# Patient Record
Sex: Male | Born: 1985 | Race: White | Hispanic: No | Marital: Single | Smoking: Current every day smoker
Health system: Southern US, Community
[De-identification: ages and names within clinical notes are randomized; demographics above are authoritative.]

## PROBLEM LIST (undated history)

## (undated) DIAGNOSIS — Z789 Other specified health status: Secondary | ICD-10-CM

## (undated) HISTORY — PX: HERNIA REPAIR: SHX51

---
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
## 2008-03-07 ENCOUNTER — Emergency Department (HOSPITAL_COMMUNITY): Admission: EM | Admit: 2008-03-07 | Discharge: 2008-03-07 | Payer: Self-pay | Admitting: Emergency Medicine

## 2008-11-03 ENCOUNTER — Emergency Department (HOSPITAL_COMMUNITY): Admission: EM | Admit: 2008-11-03 | Discharge: 2008-11-04 | Payer: Self-pay | Admitting: Emergency Medicine

## 2009-02-24 ENCOUNTER — Emergency Department (HOSPITAL_COMMUNITY): Admission: EM | Admit: 2009-02-24 | Discharge: 2009-02-24 | Payer: Self-pay | Admitting: Emergency Medicine

## 2009-10-03 ENCOUNTER — Emergency Department: Payer: Self-pay | Admitting: Emergency Medicine

## 2010-08-29 ENCOUNTER — Emergency Department: Payer: Self-pay | Admitting: Emergency Medicine

## 2010-10-27 LAB — CBC
MCHC: 34.2 g/dL (ref 30.0–36.0)
MCV: 82.4 fL (ref 78.0–100.0)
RBC: 5.29 MIL/uL (ref 4.22–5.81)
RDW: 13.2 % (ref 11.5–15.5)

## 2010-10-27 LAB — URINALYSIS, ROUTINE W REFLEX MICROSCOPIC
Bilirubin Urine: NEGATIVE
Ketones, ur: NEGATIVE mg/dL
Leukocytes, UA: NEGATIVE
Nitrite: NEGATIVE
Protein, ur: NEGATIVE mg/dL
Urobilinogen, UA: 0.2 mg/dL (ref 0.0–1.0)
pH: 6 (ref 5.0–8.0)

## 2010-10-27 LAB — DIFFERENTIAL
Basophils Absolute: 0.1 10*3/uL (ref 0.0–0.1)
Basophils Relative: 1 % (ref 0–1)
Eosinophils Absolute: 0.1 10*3/uL (ref 0.0–0.7)
Monocytes Absolute: 0.8 10*3/uL (ref 0.1–1.0)
Monocytes Relative: 10 % (ref 3–12)
Neutrophils Relative %: 62 % (ref 43–77)

## 2010-10-27 LAB — BASIC METABOLIC PANEL
CO2: 28 mEq/L (ref 19–32)
Chloride: 103 mEq/L (ref 96–112)
Creatinine, Ser: 0.92 mg/dL (ref 0.4–1.5)
GFR calc Af Amer: >60 mL/min (ref >60)
Glucose, Bld: 90 mg/dL (ref 70–99)

## 2010-12-23 ENCOUNTER — Emergency Department: Payer: Self-pay | Admitting: Emergency Medicine

## 2011-01-15 ENCOUNTER — Emergency Department: Payer: Self-pay | Admitting: Emergency Medicine

## 2011-03-21 ENCOUNTER — Emergency Department: Payer: Self-pay | Admitting: *Deleted

## 2011-04-17 ENCOUNTER — Emergency Department: Payer: Self-pay | Admitting: Unknown Physician Specialty

## 2011-04-18 ENCOUNTER — Emergency Department: Payer: Self-pay | Admitting: Emergency Medicine

## 2011-05-08 ENCOUNTER — Emergency Department (HOSPITAL_COMMUNITY)
Admission: EM | Admit: 2011-05-08 | Discharge: 2011-05-08 | Disposition: A | Discharge status: Home / Self Care | Payer: 59 | Attending: Emergency Medicine | Admitting: Emergency Medicine

## 2011-05-08 DIAGNOSIS — K029 Dental caries, unspecified: Secondary | ICD-10-CM | POA: Insufficient documentation

## 2011-05-08 DIAGNOSIS — K089 Disorder of teeth and supporting structures, unspecified: Secondary | ICD-10-CM | POA: Insufficient documentation

## 2011-05-08 DIAGNOSIS — K047 Periapical abscess without sinus: Secondary | ICD-10-CM | POA: Insufficient documentation

## 2011-06-15 ENCOUNTER — Emergency Department: Payer: Self-pay | Admitting: Emergency Medicine

## 2011-06-22 ENCOUNTER — Emergency Department: Payer: Self-pay | Admitting: Emergency Medicine

## 2011-08-13 ENCOUNTER — Encounter (HOSPITAL_COMMUNITY): Payer: Self-pay | Admitting: Emergency Medicine

## 2011-08-13 ENCOUNTER — Emergency Department (HOSPITAL_COMMUNITY)
Admission: EM | Admit: 2011-08-13 | Discharge: 2011-08-13 | Disposition: A | Discharge status: Home / Self Care | Payer: 59 | Attending: Emergency Medicine | Admitting: Emergency Medicine

## 2011-08-13 DIAGNOSIS — K0889 Other specified disorders of teeth and supporting structures: Secondary | ICD-10-CM

## 2011-08-13 DIAGNOSIS — K089 Disorder of teeth and supporting structures, unspecified: Secondary | ICD-10-CM | POA: Insufficient documentation

## 2011-08-13 DIAGNOSIS — K029 Dental caries, unspecified: Secondary | ICD-10-CM | POA: Insufficient documentation

## 2011-08-13 MED ORDER — PENICILLIN V POTASSIUM 250 MG PO TABS: 250.0000 mg | ORAL_TABLET | Freq: Four times a day (QID) | ORAL | Status: AC

## 2011-08-13 MED ORDER — HYDROCODONE-ACETAMINOPHEN 5-325 MG PO TABS: 1.0000 | ORAL_TABLET | ORAL | Status: AC | PRN

## 2011-08-13 MED ORDER — OXYCODONE-ACETAMINOPHEN 5-325 MG PO TABS
2.0000 | ORAL_TABLET | Freq: Once | ORAL | Status: AC
  Administered 2011-08-13: 2 via ORAL
  Filled 2011-08-13: qty 2

## 2011-08-13 NOTE — ED Notes (Signed)
Pt. Reports progressing left lower molar pain for 2 days , unrelieved by OTC pain medications.

## 2011-08-13 NOTE — ED Provider Notes (Signed)
History     CSN: [id number]  Arrival date & time 08/13/11  2016   First MD Initiated Contact with Patient 08/13/11 2136      Chief Complaint  Patient presents with  . Dental Pain    (Consider location/radiation/quality/duration/timing/severity/associated sxs/prior treatment) Patient is a 26 y.o. male presenting with tooth pain. The history is provided by the patient. No language interpreter was used.  Dental PainThe primary symptoms include mouth pain. The symptoms began 2 days ago. The symptoms are worsening. The symptoms occur frequently.  Additional symptoms include: dental sensitivity to temperature and gum tenderness.    History reviewed. No pertinent past medical history.  Past Surgical History  Procedure Date  . Hernia repair     No family history on file.  History  Substance Use Topics  . Smoking status: Current Everyday Smoker  . Smokeless tobacco: Not on file  . Alcohol Use: No      Review of Systems  HENT: Positive for dental problem.   All other systems reviewed and are negative.    Allergies  Review of patient's allergies indicates no known allergies.  Home Medications   Current Outpatient Rx  Name Route Sig Dispense Refill  . HYDROCODONE-ACETAMINOPHEN 5-325 MG PO TABS Oral Take 1 tablet by mouth every 4 (four) hours as needed for pain. 20 tablet 0  . PENICILLIN V POTASSIUM 250 MG PO TABS Oral Take 1 tablet (250 mg total) by mouth 4 (four) times daily. 40 tablet 0    BP 137/54  Pulse 97  Temp(Src) 97.5 F (36.4 C) (Oral)  Resp 20  SpO2 98%  Physical Exam  Constitutional: He is oriented to person, place, and time. He appears well-developed and well-nourished.  HENT:  Head: Normocephalic.  Mouth/Throat: Dental caries present.    Eyes: Pupils are equal, round, and reactive to light.  Neck: Normal range of motion. Neck supple.  Cardiovascular: Normal rate, regular rhythm, normal heart sounds and intact distal pulses.   Pulmonary/Chest:  Effort normal and breath sounds normal.  Abdominal: Soft. Bowel sounds are normal.  Musculoskeletal: Normal range of motion.  Lymphadenopathy:    He has cervical adenopathy.  Neurological: He is alert and oriented to person, place, and time.  Skin: Skin is warm and dry.  Psychiatric: He has a normal mood and affect.    ED Course  Procedures (including critical care time)  Labs Reviewed - No data to display No results found.   1. Dental caries   2. Pain, dental       MDM          Jimmye Norman, NP 08/13/11 2320

## 2011-08-13 NOTE — ED Notes (Signed)
Complaining of tooth pain left lower tooth. States pain began 2 days ago and has gotten progressively worse. Has history of abscess. Has tried several OTC with no relief. Rates pain as 7/10

## 2011-08-13 NOTE — ED Provider Notes (Signed)
Medical screening examination/treatment/procedure(s) were performed by non-physician practitioner and as supervising physician I was immediately available for consultation/collaboration.  Areyanna Figeroa R. Damaris Abeln, MD 08/13/11 2330 

## 2012-05-31 ENCOUNTER — Emergency Department: Payer: Self-pay | Admitting: Emergency Medicine

## 2012-06-25 ENCOUNTER — Emergency Department: Payer: Self-pay | Admitting: Emergency Medicine

## 2012-06-25 LAB — URINALYSIS, COMPLETE
Blood: NEGATIVE
Ph: 8 (ref 4.5–8.0)
Protein: NEGATIVE
Specific Gravity: 1.019 (ref 1.003–1.030)
Squamous Epithelial: <1
WBC UR: 1 /HPF (ref 0–5)

## 2012-07-09 ENCOUNTER — Emergency Department: Payer: Self-pay | Admitting: Emergency Medicine

## 2012-07-09 LAB — URINALYSIS, COMPLETE
Bacteria: NONE SEEN
Glucose,UR: NEGATIVE mg/dL (ref 0–75)
Nitrite: NEGATIVE
Protein: NEGATIVE
RBC,UR: NONE SEEN /HPF (ref 0–5)
Specific Gravity: 1.005 (ref 1.003–1.030)
WBC UR: 1 /HPF (ref 0–5)

## 2012-10-25 ENCOUNTER — Emergency Department (HOSPITAL_COMMUNITY)
Admission: EM | Admit: 2012-10-25 | Discharge: 2012-10-25 | Disposition: A | Discharge status: Home / Self Care | Payer: 59 | Attending: Emergency Medicine | Admitting: Emergency Medicine

## 2012-10-25 ENCOUNTER — Encounter (HOSPITAL_COMMUNITY): Payer: Self-pay | Admitting: Family Medicine

## 2012-10-25 ENCOUNTER — Emergency Department: Payer: Self-pay | Admitting: Emergency Medicine

## 2012-10-25 DIAGNOSIS — K0889 Other specified disorders of teeth and supporting structures: Secondary | ICD-10-CM

## 2012-10-25 DIAGNOSIS — K089 Disorder of teeth and supporting structures, unspecified: Secondary | ICD-10-CM | POA: Insufficient documentation

## 2012-10-25 DIAGNOSIS — Z8719 Personal history of other diseases of the digestive system: Secondary | ICD-10-CM | POA: Insufficient documentation

## 2012-10-25 DIAGNOSIS — F17200 Nicotine dependence, unspecified, uncomplicated: Secondary | ICD-10-CM | POA: Insufficient documentation

## 2012-10-25 MED ORDER — HYDROCODONE-ACETAMINOPHEN 5-325 MG PO TABS: ORAL_TABLET | ORAL | Status: DC

## 2012-10-25 MED ORDER — IBUPROFEN 600 MG PO TABS: 600.0000 mg | ORAL_TABLET | Freq: Four times a day (QID) | ORAL | Status: DC | PRN

## 2012-10-25 MED ORDER — PENICILLIN V POTASSIUM 500 MG PO TABS: 500.0000 mg | ORAL_TABLET | Freq: Three times a day (TID) | ORAL | Status: DC

## 2012-10-25 NOTE — ED Provider Notes (Signed)
History    This chart was scribed for non-physician practitioner Renne Crigler PA-C working with Richardean Canal, MD by Smitty Pluck, ED scribe. This patient was seen in room WTR5/WTR5 and the patient's care was started at 11:00 PM.   CSN: [id number]  Arrival date & time 10/25/12  2222      Chief Complaint  Patient presents with  . Dental Pain     The history is provided by the patient. No language interpreter was used.   Nicolas Bowers is a 27 y.o. male who presents to the Emergency Department complaining of constant, moderate left lower dental pain onset 3 days ago. Pt reports that he has taken ibuprofen and tylenol without relief. He reports that he has mild swelling. He thinks that it is due to a cavity. He reports hx of dental caries. Pt has dental appointment in 2 weeks.  Pt denies fever, chills, nausea, vomiting, diarrhea, weakness, cough, SOB and any other pain. The onset of this condition was acute. The course is constant. Aggravating factors: none. Alleviating factors: none.      History reviewed. No pertinent past medical history.  Past Surgical History  Procedure Laterality Date  . Hernia repair      No family history on file.  History  Substance Use Topics  . Smoking status: Current Every Day Smoker -- 1.00 packs/day    Types: Cigarettes  . Smokeless tobacco: Not on file  . Alcohol Use: No      Review of Systems  Constitutional: Negative for fever and chills.  HENT: Positive for dental problem. Negative for ear pain, sore throat, facial swelling, trouble swallowing and neck pain.   Respiratory: Negative for shortness of breath and stridor.   Gastrointestinal: Negative for nausea and vomiting.  Skin: Negative for color change.  Neurological: Negative for weakness and headaches.    Allergies  Review of patient's allergies indicates no known allergies.  Home Medications   Current Outpatient Rx  Name  Route  Sig  Dispense  Refill  .  HYDROcodone-acetaminophen (NORCO/VICODIN) 5-325 MG per tablet      Take 1-2 tablets every 6 hours as needed for severe pain   8 tablet   0   . ibuprofen (ADVIL,MOTRIN) 600 MG tablet   Oral   Take 1 tablet (600 mg total) by mouth every 6 (six) hours as needed for pain.   20 tablet   0   . penicillin v potassium (VEETID) 500 MG tablet   Oral   Take 1 tablet (500 mg total) by mouth 3 (three) times daily.   21 tablet   0     BP 114/57  Pulse 84  Temp(Src) 98.4 F (36.9 C) (Oral)  Resp 18  SpO2 97%  Physical Exam  Nursing note and vitals reviewed. Constitutional: He appears well-developed and well-nourished. No distress.  HENT:  Head: Normocephalic and atraumatic.  Left mandibular 2nd molar is broken  No abscess No erythema  No facial swelling   Eyes: EOM are normal.  Neck: Neck supple. No tracheal deviation present.  Cardiovascular: Normal rate.   Pulmonary/Chest: Effort normal. No respiratory distress.  Musculoskeletal: Normal range of motion.  Neurological: He is alert.  Skin: Skin is warm and dry.  Psychiatric: He has a normal mood and affect. His behavior is normal.    ED Course  Procedures (including critical care time) DIAGNOSTIC STUDIES: Oxygen Saturation is 97% on room air, normal by my interpretation.    COORDINATION OF CARE: 11:02  PM Discussed ED treatment with pt and pt agrees.  Filed Vitals:   10/25/12 2236  BP: 114/57  Pulse: 84  Temp: 98.4 F (36.9 C)  TempSrc: Oral  Resp: 18  SpO2: 97%      Labs Reviewed - No data to display No results found.   1. Pain, dental    Patient seen and examined.   Vital signs reviewed and are as follows: Filed Vitals:   10/25/12 2236  BP: 114/57  Pulse: 84  Temp: 98.4 F (36.9 C)  Resp: 18    Patient counseled to take prescribed medications as directed, return with worsening facial or neck swelling, and to follow-up with their dentist as soon as possible.   Patient counseled on use of narcotic  pain medications. Counseled not to combine these medications with others containing tylenol. Urged not to drink alcohol, drive, or perform any other activities that requires focus while taking these medications. The patient verbalizes understanding and agrees with the plan.     MDM  Patient with toothache.  No gross abscess.  Exam unconcerning for Ludwig's angina or other deep tissue infection in neck.  Will treat with penicillin and pain medicine.  Urged patient to follow-up with dentist.         I personally performed the services described in this documentation, which was scribed in my presence. The recorded information has been reviewed and is accurate.    Renne Crigler, PA-C 10/26/12 [phone]

## 2012-10-25 NOTE — ED Notes (Addendum)
Patient states that he has a toothache for the past 3 days. States pain has gotten worse. Has dentist appt in 2 weeks but pain is too bad. Broken tooth noted to left lower rear molar.

## 2012-10-28 NOTE — ED Provider Notes (Signed)
Medical screening examination/treatment/procedure(s) were performed by non-physician practitioner and as supervising physician I was immediately available for consultation/collaboration.   David H Yao, MD 10/28/12 2257 

## 2012-11-07 ENCOUNTER — Emergency Department (HOSPITAL_COMMUNITY)
Admission: EM | Admit: 2012-11-07 | Discharge: 2012-11-07 | Disposition: A | Discharge status: Home / Self Care | Payer: 59 | Attending: Emergency Medicine | Admitting: Emergency Medicine

## 2012-11-07 ENCOUNTER — Encounter (HOSPITAL_COMMUNITY): Payer: Self-pay | Admitting: *Deleted

## 2012-11-07 DIAGNOSIS — K089 Disorder of teeth and supporting structures, unspecified: Secondary | ICD-10-CM | POA: Insufficient documentation

## 2012-11-07 DIAGNOSIS — K0889 Other specified disorders of teeth and supporting structures: Secondary | ICD-10-CM

## 2012-11-07 DIAGNOSIS — F17200 Nicotine dependence, unspecified, uncomplicated: Secondary | ICD-10-CM | POA: Insufficient documentation

## 2012-11-07 MED ORDER — HYDROCODONE-ACETAMINOPHEN 5-325 MG PO TABS
1.0000 | ORAL_TABLET | Freq: Once | ORAL | Status: AC
  Administered 2012-11-07: 1 via ORAL
  Filled 2012-11-07: qty 1

## 2012-11-07 MED ORDER — HYDROCODONE-ACETAMINOPHEN 5-325 MG PO TABS: 1.0000 | ORAL_TABLET | ORAL | Status: DC | PRN

## 2012-11-07 NOTE — ED Notes (Signed)
Pt reporting toothache on left lower side.  Reports pain for about 3 days.

## 2012-11-07 NOTE — ED Provider Notes (Signed)
History     CSN: [id number]  Arrival date & time 11/07/12  0440   First MD Initiated Contact with Patient 11/07/12 0518      Chief Complaint  Patient presents with  . Dental Pain    (Consider location/radiation/quality/duration/timing/severity/associated sxs/prior treatment) HPI Nicolas Bowers is a 27 y.o. male who presents to the Emergency Department complaining of dental pain. He as seen in the ER 10/25/12 and given penicillin and hydrocodone for pain in the 2nd molar of the left mandibular. He is finished with the penicillin and has finished the hydrocodone. His appointment with the [facility] dental [facility] is this coming week. He has been taking ibuprofen without relief. He denies fever, chills, abscess, erythema, facial swelling.   History reviewed. No pertinent past medical history.  Past Surgical History  Procedure Laterality Date  . Hernia repair      History reviewed. No pertinent family history.  History  Substance Use Topics  . Smoking status: Current Every Day Smoker -- 1.00 packs/day    Types: Cigarettes  . Smokeless tobacco: Not on file  . Alcohol Use: No      Review of Systems  Constitutional: Negative for fever.       10 Systems reviewed and are negative for acute change except as noted in the HPI.  HENT: Positive for dental problem. Negative for congestion.   Eyes: Negative for discharge and redness.  Respiratory: Negative for cough and shortness of breath.   Cardiovascular: Negative for chest pain.  Gastrointestinal: Negative for vomiting and abdominal pain.  Musculoskeletal: Negative for back pain.  Skin: Negative for rash.  Neurological: Negative for syncope, numbness and headaches.  Psychiatric/Behavioral:       No behavior change.    Allergies  Review of patient's allergies indicates no known allergies.  Home Medications   Current Outpatient Rx  Name  Route  Sig  Dispense  Refill  . HYDROcodone-acetaminophen (NORCO/VICODIN) 5-325 MG per  tablet      Take 1-2 tablets every 6 hours as needed for severe pain   8 tablet   0   . ibuprofen (ADVIL,MOTRIN) 600 MG tablet   Oral   Take 1 tablet (600 mg total) by mouth every 6 (six) hours as needed for pain.   20 tablet   0   . penicillin v potassium (VEETID) 500 MG tablet   Oral   Take 1 tablet (500 mg total) by mouth 3 (three) times daily.   21 tablet   0     BP 110/78  Pulse 83  Temp(Src) 97.9 F (36.6 C) (Oral)  Resp 20  Ht 5\' 6"  (1.676 m)  Wt 130 lb (58.968 kg)  BMI 20.99 kg/m2  SpO2 96%  Physical Exam  Nursing note and vitals reviewed. Constitutional:  Awake, alert, nontoxic appearance.  HENT:  Head: Atraumatic.  2nd molar left mandibular broken.  Eyes: Right eye exhibits no discharge. Left eye exhibits no discharge.  Neck: Neck supple.  Cardiovascular: Normal rate.   Pulmonary/Chest: Effort normal and breath sounds normal. He exhibits no tenderness.  Abdominal: Soft. There is no tenderness. There is no rebound.  Musculoskeletal: He exhibits no tenderness.  Baseline ROM, no obvious new focal weakness.  Neurological:  Mental status and motor strength appears baseline for patient and situation.  Skin: No rash noted.  Psychiatric: He has a normal mood and affect.    ED Course  Procedures (including critical care time)  Medications  HYDROcodone-acetaminophen (NORCO/VICODIN) 5-325 MG per tablet 1  tablet (not administered)     MDM  Patient with pain to the 2nd molar of left mandibular area. No abscess noted. Given hydrocodone. Pt stable in ED with no significant deterioration in condition.The patient appears reasonably screened and/or stabilized for discharge and I doubt any other medical condition or other [facility] requiring further screening, evaluation, or treatment in the ED at this time prior to discharge.  MDM Reviewed: nursing note and vitals           Nicoletta Dress. Colon Branch, MD 11/07/12 [phone]

## 2013-02-04 ENCOUNTER — Encounter (HOSPITAL_COMMUNITY): Payer: Self-pay

## 2013-02-04 ENCOUNTER — Emergency Department (HOSPITAL_COMMUNITY)
Admission: EM | Admit: 2013-02-04 | Discharge: 2013-02-05 | Disposition: A | Discharge status: Home / Self Care | Payer: 59 | Attending: Emergency Medicine | Admitting: Emergency Medicine

## 2013-02-04 DIAGNOSIS — N509 Disorder of male genital organs, unspecified: Secondary | ICD-10-CM | POA: Insufficient documentation

## 2013-02-04 DIAGNOSIS — F17200 Nicotine dependence, unspecified, uncomplicated: Secondary | ICD-10-CM | POA: Insufficient documentation

## 2013-02-04 DIAGNOSIS — N50811 Right testicular pain: Secondary | ICD-10-CM

## 2013-02-04 LAB — URINALYSIS, ROUTINE W REFLEX MICROSCOPIC
Bilirubin Urine: NEGATIVE
Hgb urine dipstick: NEGATIVE
Nitrite: NEGATIVE
Protein, ur: NEGATIVE mg/dL
Urobilinogen, UA: 0.2 mg/dL (ref 0.0–1.0)

## 2013-02-04 MED ORDER — NAPROXEN 250 MG PO TABS
500.0000 mg | ORAL_TABLET | Freq: Once | ORAL | Status: AC
Start: 2013-02-04 — End: 2013-02-04
  Administered 2013-02-04: 500 mg via ORAL
  Filled 2013-02-04: qty 2

## 2013-02-04 MED ORDER — CEFTRIAXONE SODIUM 250 MG IJ SOLR
250.0000 mg | Freq: Once | INTRAMUSCULAR | Status: AC
  Administered 2013-02-04: 250 mg via INTRAMUSCULAR
  Filled 2013-02-04: qty 250

## 2013-02-04 MED ORDER — LIDOCAINE HCL (PF) 1 % IJ SOLN
INTRAMUSCULAR | Status: AC
  Administered 2013-02-04: 5 mL
  Filled 2013-02-04: qty 5

## 2013-02-04 MED ORDER — DOXYCYCLINE HYCLATE 100 MG PO CAPS: 100.0000 mg | ORAL_CAPSULE | Freq: Two times a day (BID) | ORAL | Status: DC

## 2013-02-04 MED ORDER — NAPROXEN 500 MG PO TABS: 500.0000 mg | ORAL_TABLET | Freq: Two times a day (BID) | ORAL | Status: DC

## 2013-02-04 NOTE — ED Provider Notes (Signed)
History    This chart was scribed for Vida Roller, MD by Quintella Reichert, ED scribe.  This patient was seen in room APA07/APA07 and the patient's care was started at 10:21 PM.   CSN: [id number]  Arrival date & time 02/04/13  2116    Chief Complaint  Patient presents with  . Testicle Pain    The history is provided by the patient. No language interpreter was used.     HPI Comments: Nicolas Bowers is a 27 y.o. male who presents to the Emergency Department complaining of 3 days of constant, progressively-worsening, moderate-to-severe bilateral testicular pain.  Pt denies injury.  He notes that the right testicle is more painful than the left.  He denies penile discharge or burning during urination but states "I get pain when I urinate."     History reviewed. No pertinent past medical history.   Past Surgical History  Procedure Laterality Date  . Hernia repair      No family history on file.   History  Substance Use Topics  . Smoking status: Current Every Day Smoker -- 1.00 packs/day    Types: Cigarettes  . Smokeless tobacco: Not on file  . Alcohol Use: No     Review of Systems A complete 10 system review of systems was obtained and all systems are negative except as noted in the HPI and PMH.     Allergies  Ultram  Home Medications   Current Outpatient Rx  Name  Route  Sig  Dispense  Refill  . ibuprofen (ADVIL,MOTRIN) 200 MG tablet   Oral   Take 600 mg by mouth every 6 (six) hours as needed for pain.         Marland Kitchen doxycycline (VIBRAMYCIN) 100 MG capsule   Oral   Take 1 capsule (100 mg total) by mouth 2 (two) times daily.   20 capsule   0   . naproxen (NAPROSYN) 500 MG tablet   Oral   Take 1 tablet (500 mg total) by mouth 2 (two) times daily with a meal.   30 tablet   0    There were no vitals taken for this visit.  Physical Exam  Nursing note and vitals reviewed. Constitutional: He appears well-developed and well-nourished. No distress.   HENT:  Head: Normocephalic and atraumatic.  Mouth/Throat: Oropharynx is clear and moist. No oropharyngeal exudate.  Eyes: Conjunctivae and EOM are normal. Pupils are equal, round, and reactive to light. Right eye exhibits no discharge. Left eye exhibits no discharge. No scleral icterus.  Neck: Normal range of motion. Neck supple. No JVD present. No thyromegaly present.  Cardiovascular: Normal rate, regular rhythm, normal heart sounds and intact distal pulses.  Exam reveals no gallop and no friction rub.   No murmur heard. Pulmonary/Chest: Effort normal and breath sounds normal. No respiratory distress. He has no wheezes. He has no rales.  Abdominal: Soft. Bowel sounds are normal. He exhibits no distension and no mass. There is no tenderness.  Genitourinary:  Mild tenderness on palpation to posterior right testicular . Testicles have a normal lie bilaterally. Normal cremasteric reflex bilaterally. Normal appearing scrotum. No discharge at the urethral meatus.  Musculoskeletal: Normal range of motion. He exhibits no edema and no tenderness.  Lymphadenopathy:    He has no cervical adenopathy.  Neurological: He is alert. Coordination normal.  Skin: Skin is warm and dry. No rash noted. No erythema.  Psychiatric: He has a normal mood and affect. His behavior is normal.  ED Course  Procedures (including critical care time)  DIAGNOSTIC STUDIES: Oxygen Saturation is 100% on room air, normal by my interpretation.    COORDINATION OF CARE: 10:25 PM-Discussed treatment plan which includes pain medication and labs with pt at bedside and pt agreed to plan.    Labs Reviewed  URINALYSIS, ROUTINE W REFLEX MICROSCOPIC - Abnormal; Notable for the following:    Specific Gravity, Urine >1.030 (*)    All other components within normal limits  GC/CHLAMYDIA PROBE AMP    No results found.  1. Testicular pain, right     MDM  The patient has a completely benign testicular exam other than mild  tenderness behind the right testicle compatible with epididymitis. He has a very normal cremasteric reflex, he has a normal lie of the testicles in the scrotum, there is no redness, no swelling, no asymmetry. urinalysis is negative for infection, the patient will be treated for epididymitis.  Meds given in ED:  Medications  cefTRIAXone (ROCEPHIN) injection 250 mg (not administered)  naproxen (NAPROSYN) tablet 500 mg (500 mg Oral Given 02/04/13 2238)    New Prescriptions   DOXYCYCLINE (VIBRAMYCIN) 100 MG CAPSULE    Take 1 capsule (100 mg total) by mouth 2 (two) times daily.   NAPROXEN (NAPROSYN) 500 MG TABLET    Take 1 tablet (500 mg total) by mouth 2 (two) times daily with a meal.      I personally performed the services described in this documentation, which was scribed in my presence. The recorded information has been reviewed and is accurate.      Vida Roller, MD 02/04/13 2325

## 2013-02-04 NOTE — ED Notes (Signed)
Bilateral testicular pain, no injury noted.

## 2013-02-18 ENCOUNTER — Emergency Department: Payer: Self-pay | Admitting: Emergency Medicine

## 2013-02-18 LAB — URINALYSIS, COMPLETE
Bilirubin,UR: NEGATIVE
Blood: NEGATIVE
Glucose,UR: NEGATIVE mg/dL (ref 0–75)
Ketone: NEGATIVE
Nitrite: NEGATIVE
Protein: NEGATIVE
WBC UR: <1 /HPF (ref 0–5)

## 2013-02-18 LAB — CBC WITH DIFFERENTIAL/PLATELET
Eosinophil #: 0.3 10*3/uL (ref 0.0–0.7)
Eosinophil %: 3.4 %
HGB: 14.2 g/dL (ref 13.0–18.0)
Lymphocyte #: 3.1 10*3/uL (ref 1.0–3.6)
Lymphocyte %: 41.6 %
MCHC: 33.8 g/dL (ref 32.0–36.0)
MCV: 82 fL (ref 80–100)
Monocyte #: 0.7 x10 3/mm (ref 0.2–1.0)
Neutrophil %: 43.2 %
Platelet: 177 10*3/uL (ref 150–440)
RBC: 5.15 10*6/uL (ref 4.40–5.90)
RDW: 13.3 % (ref 11.5–14.5)
WBC: 7.4 10*3/uL (ref 3.8–10.6)

## 2013-09-27 ENCOUNTER — Emergency Department: Payer: Self-pay | Admitting: Emergency Medicine

## 2014-07-23 ENCOUNTER — Emergency Department: Payer: Self-pay | Admitting: Emergency Medicine

## 2014-10-25 ENCOUNTER — Emergency Department: Admit: 2014-10-25 | Disposition: A | Discharge status: Home / Self Care | Payer: Self-pay | Admitting: Student

## 2014-12-25 ENCOUNTER — Emergency Department
Admission: EM | Admit: 2014-12-25 | Discharge: 2014-12-25 | Disposition: A | Discharge status: Home / Self Care | Payer: Self-pay | Attending: Emergency Medicine | Admitting: Emergency Medicine

## 2014-12-25 ENCOUNTER — Encounter: Payer: Self-pay | Admitting: Emergency Medicine

## 2014-12-25 DIAGNOSIS — Z720 Tobacco use: Secondary | ICD-10-CM | POA: Insufficient documentation

## 2014-12-25 DIAGNOSIS — K0381 Cracked tooth: Secondary | ICD-10-CM | POA: Insufficient documentation

## 2014-12-25 DIAGNOSIS — K0889 Other specified disorders of teeth and supporting structures: Secondary | ICD-10-CM

## 2014-12-25 DIAGNOSIS — K088 Other specified disorders of teeth and supporting structures: Secondary | ICD-10-CM | POA: Insufficient documentation

## 2014-12-25 MED ORDER — AMOXICILLIN 500 MG PO TABS: 500.0000 mg | ORAL_TABLET | Freq: Three times a day (TID) | ORAL | Status: DC

## 2014-12-25 MED ORDER — OXYCODONE-ACETAMINOPHEN 5-325 MG PO TABS: 1.0000 | ORAL_TABLET | ORAL | Status: DC | PRN

## 2014-12-25 MED ORDER — LIDOCAINE VISCOUS 2 % MT SOLN: 20.0000 mL | OROMUCOSAL | Status: DC | PRN

## 2014-12-25 MED ORDER — HYDROCODONE-ACETAMINOPHEN 5-325 MG PO TABS: 1.0000 | ORAL_TABLET | ORAL | Status: DC | PRN

## 2014-12-25 NOTE — ED Provider Notes (Signed)
[facility] Emergency Department Provider Note  ____________________________________________  Time seen: Approximately 10:14 AM  I have reviewed the triage vital signs and the nursing notes.   HISTORY  Chief Complaint Dental Pain    HPI Nicolas Bowers is a 29 y.o. male resents for evaluation of right lower dental pain. He reports that his tooth chipped yesterday. 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   History reviewed. No pertinent past medical history.  There are no active problems to display for this patient.   Past Surgical History  Procedure Laterality Date  . Hernia repair      Current Outpatient Rx  Name  Route  Sig  Dispense  Refill  . amoxicillin (AMOXIL) 500 MG tablet   Oral   Take 1 tablet (500 mg total) by mouth 3 (three) times daily.   30 tablet   0   . HYDROcodone-acetaminophen (NORCO) 5-325 MG per tablet   Oral   Take 1 tablet by mouth every 4 (four) hours as needed for moderate pain.   12 tablet   0   . ibuprofen (ADVIL,MOTRIN) 200 MG tablet   Oral   Take 600 mg by mouth every 6 (six) hours as needed for pain.         Marland Kitchen lidocaine (XYLOCAINE) 2 % solution   Mouth/Throat   Use as directed 20 mLs in the mouth or throat as needed for mouth pain.   100 mL   0   . naproxen (NAPROSYN) 500 MG tablet   Oral   Take 1 tablet (500 mg total) by mouth 2 (two) times daily with a meal.   30 tablet   0   . oxyCODONE-acetaminophen (ROXICET) 5-325 MG per tablet   Oral   Take 1-2 tablets by mouth every 4 (four) hours as needed for severe pain.   15 tablet   0     Allergies Ultram  History reviewed. No pertinent family history.  Social History History  Substance Use Topics  . Smoking status: Current Every Day Smoker -- 1.00 packs/day    Types: Cigarettes  . Smokeless tobacco: Not on file  . Alcohol Use: No    Review of Systems Constitutional: No fever/chills Eyes: No visual  changes. ENT: No sore throat. Positive right lower dental pain. Cardiovascular: Denies chest pain. Respiratory: Denies shortness of breath. Gastrointestinal: No abdominal pain.  No nausea, no vomiting.  No diarrhea.  No constipation. Genitourinary: Negative for dysuria. Musculoskeletal: Negative for back pain. Skin: Negative for rash. Neurological: Negative for headaches, focal weakness or numbness.  10-point ROS otherwise negative.  ____________________________________________   PHYSICAL EXAM:  VITAL SIGNS: ED Triage Vitals  Enc Vitals Group     BP 12/25/14 0958 137/80 mmHg     Pulse Rate 12/25/14 0958 77     Resp 12/25/14 0958 18     Temp 12/25/14 0958 97.8 F (36.6 C)     Temp Source 12/25/14 0958 Oral     SpO2 12/25/14 0958 100 %     Weight 12/25/14 0958 130 lb (58.968 kg)     Height 12/25/14 0958  (1.676 m)     Head Cir --      Peak Flow --      Pain Score 12/25/14 0959 8     Pain Loc --      Pain Edu? --      Excl. in GC? --  Constitutional: Alert and oriented. Well appearing and in no acute distress. Eyes: Conjunctivae are normal. PERRL. EOMI. Head: Atraumatic. Nose: No congestion/rhinnorhea. Mouth/Throat: Mucous membranes are moist.  Oropharynx non-erythematous. Obvious broken tooth with dental carries Neck: No stridor.  No Cervical adenopathy Neurologic:  Normal speech and language. No gross focal neurologic deficits are appreciated. Speech is normal. No gait instability. Skin:  Skin is warm, dry and intact. No rash noted. Psychiatric: Mood and affect are normal. Speech and behavior are normal.  ____________________________________________   LABS (all labs ordered are listed, but only abnormal results are displayed)  Labs Reviewed - No data to  display ____________________________________________  EKG  Deferred ____________________________________________  RADIOLOGY  Deferred ____________________________________________   PROCEDURES  Procedure(s) performed: None  Critical Care performed: No  ____________________________________________   INITIAL IMPRESSION / ASSESSMENT AND PLAN / ED COURSE  Pertinent labs & imaging results that were available during my care of the patient were reviewed by me and considered in my medical decision making (see chart for details).  Broken tooth/dental caries. Rx given for amoxicillin and Motrin 800 Percocet and viscous lidocaine. Patient given list of local dental clinics for follow-up. Patient voices no other emergency medical complaints at this time. He will return to the ER if symptoms worsen. ____________________________________________   FINAL CLINICAL IMPRESSION(S) / ED DIAGNOSES  Final diagnoses:  Pain, dental      Evangeline DakinCharles M Neizan Debruhl, PA-C 12/25/14 1040  Minna AntisKevin Paduchowski, MD 12/25/14 [phone]

## 2014-12-25 NOTE — Discharge Instructions (Signed)
Dental Pain °A tooth ache may be caused by cavities (tooth decay). Cavities expose the nerve of the tooth to air and hot or cold temperatures. It may come from an infection or abscess (also called a boil or furuncle) around your tooth. It is also often caused by dental caries (tooth decay). This causes the pain you are having. °DIAGNOSIS  °Your caregiver can diagnose this problem by exam. °TREATMENT  °· If caused by an infection, it may be treated with medications which kill germs (antibiotics) and pain medications as prescribed by your caregiver. Take medications as directed. °· Only take over-the-counter or prescription medicines for pain, discomfort, or fever as directed by your caregiver. °· Whether the tooth ache today is caused by infection or dental disease, you should see your dentist as soon as possible for further care. °SEEK MEDICAL CARE IF: °The exam and treatment you received today has been provided on an emergency basis only. This is not a substitute for complete medical or dental care. If your problem worsens or new problems (symptoms) appear, and you are unable to meet with your dentist, call or return to this location. °SEEK IMMEDIATE MEDICAL CARE IF:  °· You have a fever. °· You develop redness and swelling of your face, jaw, or neck. °· You are unable to open your mouth. °· You have severe pain uncontrolled by pain medicine. °MAKE SURE YOU:  °· Understand these instructions. °· Will watch your condition. °· Will get help right away if you are not doing well or get worse. °Document Released: 07/08/2005 Document Revised: 09/30/2011 Document Reviewed: 02/24/2008 °ExitCare® Patient Information ©2015 [employer]. This information is not intended to replace advice given to you by your health care provider. Make sure you discuss any questions you have with your health care provider. °OPTIONS FOR DENTAL FOLLOW UP CARE ° °[place] Department of Health and Human Services - Local Safety Net Dental  Clinics °http://www.ncdhhs.gov/dph/oralhealth/services/safetynetclinics.htm °  °[facility] (336-562-3123) ° °[facility] (919-933-9087) ° °[facility] (919-663-1744 ext 237) ° °[facility] (336-570-6415) ° °[facility] (919-968-2025) °This clinic caters to the indigent population and is on a lottery system. °Location: °[facility], [address], [place] °Clinic Hours: °Wednesdays from 6pm - 9pm, patients seen by a lottery system. °For dates, call or go to www.med.[facility].edu/[facility]/patients/Dental-[facility] °Services: °Cleanings, fillings and simple extractions. °Payment Options: °DENTAL WORK IS FREE OF CHARGE. Bring proof of income or support. °Best way to get seen: °Arrive at 5:15 pm - this is a lottery, NOT first come/first serve, so arriving earlier will not increase your chances of being seen. °  °  °[facility] °919-537-3737 °Select option 1 for emergencies °  °Location: °[facility], [address], [place] °Clinic Hours: °No walk-ins accepted - call the day before to schedule an appointment. °Check in times are 9:30 am and 1:30 pm. °Services: °Simple extractions, temporary fillings, pulpectomy/pulp debridement, uncomplicated abscess drainage. °Payment Options: °PAYMENT IS DUE AT THE TIME OF SERVICE.  Fee is usually $100-200, additional surgical procedures (e.g. abscess drainage) may be extra. °Cash, checks, Visa/MasterCard accepted.  Can file Medicaid if patient is covered for dental - patient should call case worker to check. °No discount for [facility] patients. °Best way to get seen: °MUST call the day before and get onto the schedule. Can usually be seen the next 1-2 days. No walk-ins accepted. °  °  °[facility] °[phone] °  °Location: °[facility], [address], [place] °Clinic Hours: °M, W, Th, F 8am or 1:30pm, Tues  9a or 1:30 - first come/first served. °Services: °Simple extractions, temporary fillings, uncomplicated abscess drainage.  You do not need to be an [place] resident. °Payment Options: °PAYMENT IS DUE AT THE TIME OF SERVICE. °Dental insurance, otherwise sliding scale - bring proof of income or support. °Depending on income and treatment needed, cost is usually $50-200. °Best way to get seen: °Arrive early as it is first come/first served. °  °  °[facility] °919-542-1641 °  °Location: °[address] °Clinic Hours: °Mon-Thu 8a-5p °Services: °Most basic dental services including extractions and fillings. °Payment Options: °PAYMENT IS DUE AT THE TIME OF SERVICE. °Sliding scale, up to 50% off - bring proof if income or support. °Medicaid with dental option accepted. °Best way to get seen: °Call to schedule an appointment, can usually be seen within 2 weeks OR they will try to see walk-ins - show up at 8a or 2p (you may have to wait). °  °  °[facility] °919-245-2435 °[place] RESIDENTS ONLY °  °Location: °[facility], [address], [place], [place] [address] °Clinic Hours: By appointment only. °Monday - Thursday 8am-5pm, Friday 8am-12pm °Services: Cleanings, fillings, extractions. °Payment Options: °PAYMENT IS DUE AT THE TIME OF SERVICE. °Cash, Visa or MasterCard. Sliding scale - $30 minimum per service. °Best way to get seen: °Come in to office, complete packet and make an appointment - need proof of income °or support monies for each household member and proof of [place] residence. °Usually takes about a month to get in. °  °  °[facility] °[phone] °  °Location: °[address] [place] °Clinic Hours: Walk-in Urgent Care Dental Services are offered Monday-Friday mornings only. °The numbers of emergencies accepted daily is limited to the number of °providers available. °Maximum 15 -  Mondays, Wednesdays & Thursdays °Maximum 10 - Tuesdays & Fridays °Services: °You do not need to be a [place] resident to be seen for a dental emergency. °Emergencies are defined as pain, swelling, abnormal bleeding, or dental trauma. Walkins will receive x-rays if needed. °NOTE: Dental cleaning is not an emergency. °Payment Options: °PAYMENT IS DUE AT THE TIME OF SERVICE. °Minimum co-pay is $40.00 for uninsured patients. °Minimum co-pay is $3.00 for Medicaid with dental coverage. °Dental Insurance is accepted and must be presented at time of visit. °Medicare does not cover dental. °Forms of payment: Cash, credit card, checks. °Best way to get seen: °If not previously registered with the clinic, walk-in dental registration begins at 7:15 am and is on a first come/first serve basis. °If previously registered with the clinic, call to make an appointment. °  °  °[facility] °919-776-4359 °[place] RESIDENTS ONLY °  °Location: °[address], [place], [place] °Clinic Hours: °Mon-Thu 10a-2p °Services: Extractions only! °Payment Options: °FREE (donations accepted) - bring proof of income or support °Best way to get seen: °Call and schedule an appointment OR come at 8am on the 1st Monday of every month (except for holidays) when it is first come/first served. °  °  °[facility] °[phone] °  °Location: °[address],  °Clinic Hours: °Friday mornings °Services, Payment Options, Best way to get seen: °Call for info °

## 2014-12-25 NOTE — ED Notes (Signed)
Patient c.o dental pain that started after tooth chipped yesterday. Pain located on right lower side.

## 2015-07-17 ENCOUNTER — Encounter: Payer: Self-pay | Admitting: Emergency Medicine

## 2015-07-17 ENCOUNTER — Emergency Department: Payer: BLUE CROSS/BLUE SHIELD

## 2015-07-17 ENCOUNTER — Emergency Department
Admission: EM | Admit: 2015-07-17 | Discharge: 2015-07-17 | Disposition: A | Discharge status: Home / Self Care | Payer: BLUE CROSS/BLUE SHIELD | Attending: Emergency Medicine | Admitting: Emergency Medicine

## 2015-07-17 DIAGNOSIS — J320 Chronic maxillary sinusitis: Secondary | ICD-10-CM | POA: Insufficient documentation

## 2015-07-17 DIAGNOSIS — R51 Headache: Secondary | ICD-10-CM | POA: Diagnosis present

## 2015-07-17 DIAGNOSIS — F17210 Nicotine dependence, cigarettes, uncomplicated: Secondary | ICD-10-CM | POA: Insufficient documentation

## 2015-07-17 DIAGNOSIS — Z792 Long term (current) use of antibiotics: Secondary | ICD-10-CM | POA: Insufficient documentation

## 2015-07-17 MED ORDER — NAPROXEN 500 MG PO TABS: 500.0000 mg | ORAL_TABLET | Freq: Two times a day (BID) | ORAL | Status: DC

## 2015-07-17 MED ORDER — SULFAMETHOXAZOLE-TRIMETHOPRIM 800-160 MG PO TABS: 1.0000 | ORAL_TABLET | Freq: Two times a day (BID) | ORAL | Status: DC

## 2015-07-17 MED ORDER — OXYCODONE-ACETAMINOPHEN 5-325 MG PO TABS
2.0000 | ORAL_TABLET | Freq: Once | ORAL | Status: AC
  Administered 2015-07-17: 2 via ORAL
  Filled 2015-07-17: qty 2

## 2015-07-17 MED ORDER — FEXOFENADINE-PSEUDOEPHED ER 60-120 MG PO TB12: 1.0000 | ORAL_TABLET | Freq: Two times a day (BID) | ORAL | Status: DC

## 2015-07-17 MED ORDER — OXYCODONE-ACETAMINOPHEN 5-325 MG PO TABS: 1.0000 | ORAL_TABLET | ORAL | Status: DC | PRN

## 2015-07-17 MED ORDER — SULFAMETHOXAZOLE-TRIMETHOPRIM 800-160 MG PO TABS
1.0000 | ORAL_TABLET | Freq: Once | ORAL | Status: AC
  Administered 2015-07-17: 1 via ORAL
  Filled 2015-07-17: qty 1

## 2015-07-17 NOTE — ED Notes (Signed)
Left sided facial pain, congestion for 2 days now.

## 2015-07-17 NOTE — ED Notes (Signed)
Pt in w/ complaints of left side facial pain and congestion x 2 days; pt states, "I have a sinus infection and I feel like my eyes are about to pop out of my head."  Pt in no immediate distress at this time.

## 2015-07-17 NOTE — ED Provider Notes (Signed)
[facility] Emergency Department Provider Note  ____________________________________________  Time seen: Approximately 11:16 AM  I have reviewed the triage vital signs and the nursing notes.   HISTORY  Chief Complaint Facial Pain and Nasal Congestion    HPI Nicolas Bowers is a 29 y.o. male patient complain severe headache and left upper facial pain for 2 days. Patient says he believes he has a sinus infection file like his eyes about the pop out of his head. He denies any fever associated this complaint. Patient's redness pain is 10 over 10 describe his pain as sharp and pressure. No palliative measures taken this complaint.   History reviewed. No pertinent past medical history.  There are no active problems to display for this patient.   Past Surgical History  Procedure Laterality Date  . Hernia repair      Current Outpatient Rx  Name  Route  Sig  Dispense  Refill  . amoxicillin (AMOXIL) 500 MG tablet   Oral   Take 1 tablet (500 mg total) by mouth 3 (three) times daily.   30 tablet   0   . fexofenadine-pseudoephedrine (ALLEGRA-D) 60-120 MG 12 hr tablet   Oral   Take 1 tablet by mouth 2 (two) times daily.   30 tablet   0   . HYDROcodone-acetaminophen (NORCO) 5-325 MG per tablet   Oral   Take 1 tablet by mouth every 4 (four) hours as needed for moderate pain.   12 tablet   0   . ibuprofen (ADVIL,MOTRIN) 200 MG tablet   Oral   Take 600 mg by mouth every 6 (six) hours as needed for pain.         Marland Kitchen. lidocaine (XYLOCAINE) 2 % solution   Mouth/Throat   Use as directed 20 mLs in the mouth or throat as needed for mouth pain.   100 mL   0   . naproxen (NAPROSYN) 500 MG tablet   Oral   Take 1 tablet (500 mg total) by mouth 2 (two) times daily with a meal.   30 tablet   0   . naproxen (NAPROSYN) 500 MG tablet   Oral   Take 1 tablet (500 mg total) by mouth 2 (two) times daily with a meal.   20 tablet   0   . oxyCODONE-acetaminophen  (ROXICET) 5-325 MG per tablet   Oral   Take 1-2 tablets by mouth every 4 (four) hours as needed for severe pain.   15 tablet   0   . oxyCODONE-acetaminophen (ROXICET) 5-325 MG tablet   Oral   Take 1 tablet by mouth every 4 (four) hours as needed for severe pain.   20 tablet   0   . sulfamethoxazole-trimethoprim (BACTRIM DS,SEPTRA DS) 800-160 MG tablet   Oral   Take 1 tablet by mouth 2 (two) times daily.   20 tablet   0     Allergies Ultram  No family history on file.  Social History Social History  Substance Use Topics  . Smoking status: Current Every Day Smoker -- 1.00 packs/day    Types: Cigarettes  . Smokeless tobacco: None  . Alcohol Use: No    Review of Systems Constitutional: No fever/chills Eyes: No visual changes. ENT: No sore throat. Left facial pain and nasal congestion. Cardiovascular: Denies chest pain. Respiratory: Denies shortness of breath. Gastrointestinal: No abdominal pain.  No nausea, no vomiting.  No diarrhea.  No constipation. Genitourinary: Negative for dysuria. Musculoskeletal: Negative for back pain. Skin:  Negative for rash. Neurological: Negative for headaches, focal weakness or numbness. Hematological/Lymphatic: Allergic/Immunilogical: Tramadol  10-point ROS otherwise negative.  ____________________________________________   PHYSICAL EXAM:  VITAL SIGNS: ED Triage Vitals  Enc Vitals Group     BP 07/17/15 1045 150/82 mmHg     Pulse Rate 07/17/15 1045 91     Resp 07/17/15 1045 18     Temp 07/17/15 1045 98.3 F (36.8 C)     Temp Source 07/17/15 1045 Oral     SpO2 07/17/15 1045 99 %     Weight 07/17/15 1045 135 lb (61.236 kg)     Height 07/17/15 1045  (1.676 m)     Head Cir --      Peak Flow --      Pain Score 07/17/15 1050 9     Pain Loc --      Pain Edu? --      Excl. in GC? --     Constitutional: Alert and oriented. Acute distress Eyes: Conjunctivae are normal. PERRL. EOMI. Head: Atraumatic. Nose: Edematous  nasal turbinates. Left Maxillary sinus guarding Mouth/Throat: Mucous membranes are moist.  Oropharynx non-erythematous. Neck: No stridor.  No cervical spine tenderness to palpation. Hematological/Lymphatic/Immunilogical: No cervical lymphadenopathy. Cardiovascular: Normal rate, regular rhythm. Grossly normal heart sounds.  Good peripheral circulation. Respiratory: Normal respiratory effort.  No retractions. Lungs CTAB. Gastrointestinal: Soft and nontender. No distention. No abdominal bruits. No CVA tenderness. Musculoskeletal: No lower extremity tenderness nor edema.  No joint effusions. Neurologic:  Normal speech and language. No gross focal neurologic deficits are appreciated. No gait instability. Skin:  Skin is warm, dry and intact. No rash noted. Psychiatric: Mood and affect are normal. Speech and behavior are normal.  ____________________________________________   LABS (all labs ordered are listed, but only abnormal results are displayed)  Labs Reviewed - No data to display ____________________________________________  EKG   ____________________________________________  RADIOLOGY  CT scanning show extensive left maxillary sinusitis ____________________________________________   PROCEDURES  Procedure(s) performed: None  Critical Care performed: No  ____________________________________________   INITIAL IMPRESSION / ASSESSMENT AND PLAN / ED COURSE  Pertinent labs & imaging results that were available during my care of the patient were reviewed by me and considered in my medical decision making (see chart for details).  Left maxillary sinusitis. Discussed CT findings with patient and advised follow-up with the ENT clinic as soon as possible. Patient will be started on amoxicillin, Percocets, and Allegra-D. ____________________________________________   FINAL CLINICAL IMPRESSION(S) / ED DIAGNOSES  Final diagnoses:  Chronic left maxillary sinusitis      Joni Reining, PA-C 07/17/15 1231  Jene Every, MD 07/17/15 1500

## 2015-07-17 NOTE — Discharge Instructions (Signed)
Last follow-up with the ENT clinic in 7-10 days for definitive evaluation and treatment. Sinusitis, Adult Sinusitis is redness, soreness, and puffiness (inflammation) of the air pockets in the bones of your face (sinuses). The redness, soreness, and puffiness can cause air and mucus to get trapped in your sinuses. This can allow germs to grow and cause an infection.  HOME CARE   Drink enough fluids to keep your pee (urine) clear or pale yellow.  Use a humidifier in your home.  Run a hot shower to create steam in the bathroom. Sit in the bathroom with the door closed. Breathe in the steam 3-4 times a day.  Put a warm, moist washcloth on your face 3-4 times a day, or as told by your doctor.  Use salt water sprays (saline sprays) to wet the thick fluid in your nose. This can help the sinuses drain.  Only take medicine as told by your doctor. GET HELP RIGHT AWAY IF:   Your pain gets worse.  You have very bad headaches.  You are sick to your stomach (nauseous).  You throw up (vomit).  You are very sleepy (drowsy) all the time.  Your face is puffy (swollen).  Your vision changes.  You have a stiff neck.  You have trouble breathing. MAKE SURE YOU:   Understand these instructions.  Will watch your condition.  Will get help right away if you are not doing well or get worse.   This information is not intended to replace advice given to you by your health care provider. Make sure you discuss any questions you have with your health care provider.   Document Released: 12/25/2007 Document Revised: 07/29/2014 Document Reviewed: 02/11/2012 Elsevier Interactive Patient Education [employer]2016 Elsevier Inc.

## 2015-10-01 ENCOUNTER — Encounter (HOSPITAL_COMMUNITY): Payer: Self-pay | Admitting: Emergency Medicine

## 2015-10-01 ENCOUNTER — Emergency Department (HOSPITAL_COMMUNITY)
Admission: EM | Admit: 2015-10-01 | Discharge: 2015-10-01 | Disposition: A | Discharge status: Home / Self Care | Payer: BLUE CROSS/BLUE SHIELD | Attending: Emergency Medicine | Admitting: Emergency Medicine

## 2015-10-01 DIAGNOSIS — N451 Epididymitis: Secondary | ICD-10-CM | POA: Insufficient documentation

## 2015-10-01 DIAGNOSIS — N50811 Right testicular pain: Secondary | ICD-10-CM | POA: Diagnosis present

## 2015-10-01 DIAGNOSIS — F17210 Nicotine dependence, cigarettes, uncomplicated: Secondary | ICD-10-CM | POA: Diagnosis not present

## 2015-10-01 DIAGNOSIS — Z791 Long term (current) use of non-steroidal anti-inflammatories (NSAID): Secondary | ICD-10-CM | POA: Diagnosis not present

## 2015-10-01 LAB — URINALYSIS, ROUTINE W REFLEX MICROSCOPIC
BILIRUBIN URINE: NEGATIVE
Glucose, UA: NEGATIVE mg/dL
HGB URINE DIPSTICK: NEGATIVE
KETONES UR: NEGATIVE mg/dL
Leukocytes, UA: NEGATIVE
Nitrite: NEGATIVE
Protein, ur: NEGATIVE mg/dL
pH: 6.5 (ref 5.0–8.0)

## 2015-10-01 MED ORDER — DOXYCYCLINE HYCLATE 100 MG PO TABS
100.0000 mg | ORAL_TABLET | Freq: Once | ORAL | Status: AC
  Administered 2015-10-01: 100 mg via ORAL
  Filled 2015-10-01: qty 1

## 2015-10-01 MED ORDER — CEFTRIAXONE SODIUM 250 MG IJ SOLR
250.0000 mg | Freq: Once | INTRAMUSCULAR | Status: AC
  Administered 2015-10-01: 250 mg via INTRAMUSCULAR
  Filled 2015-10-01: qty 250

## 2015-10-01 MED ORDER — HYDROCODONE-ACETAMINOPHEN 5-325 MG PO TABS
1.0000 | ORAL_TABLET | Freq: Once | ORAL | Status: AC
  Administered 2015-10-01: 1 via ORAL
  Filled 2015-10-01: qty 1

## 2015-10-01 MED ORDER — DOXYCYCLINE HYCLATE 100 MG PO CAPS: 100.0000 mg | ORAL_CAPSULE | Freq: Two times a day (BID) | ORAL | Status: DC

## 2015-10-01 MED ORDER — LIDOCAINE HCL (PF) 1 % IJ SOLN
INTRAMUSCULAR | Status: AC
  Administered 2015-10-01: 5 mL
  Filled 2015-10-01: qty 5

## 2015-10-01 MED ORDER — HYDROCODONE-ACETAMINOPHEN 5-325 MG PO TABS: 1.0000 | ORAL_TABLET | ORAL | Status: DC | PRN

## 2015-10-01 NOTE — ED Provider Notes (Signed)
CSN: [id number]     Arrival date & time 10/01/15  1427 History   First MD Initiated Contact with Patient 10/01/15 1438     Chief Complaint  Patient presents with  . Testicle Pain     (Consider location/radiation/quality/duration/timing/severity/associated sxs/prior Treatment) HPI   Nicolas Bowers is a 30 y.o. male who presents for evaluation of right testicle pain which started yesterday morning and has been persistent, since then. No known trauma. No associated urethral discharge. No dysuria. No new sexual contacts. He denies fever, chills, nausea, vomiting, cough, shortness of breath or chest pain. No prior similar problem. There are no other known modifying factors.   History reviewed. No pertinent past medical history. Past Surgical History  Procedure Laterality Date  . Hernia repair     History reviewed. No pertinent family history. Social History  Substance Use Topics  . Smoking status: Current Every Day Smoker -- 1.00 packs/day for 12 years    Types: Cigarettes  . Smokeless tobacco: Never Used  . Alcohol Use: No    Review of Systems  All other systems reviewed and are negative.     Allergies  Ultram  Home Medications   Prior to Admission medications   Medication Sig Start Date End Date Taking? Authorizing Provider  ibuprofen (ADVIL,MOTRIN) 200 MG tablet Take 800 mg by mouth every 6 (six) hours as needed for pain.    Yes Historical Provider, MD  amoxicillin (AMOXIL) 500 MG tablet Take 1 tablet (500 mg total) by mouth 3 (three) times daily. 12/25/14   Charmayne Sheerharles M Beers, PA-C  doxycycline (VIBRAMYCIN) 100 MG capsule Take 1 capsule (100 mg total) by mouth 2 (two) times daily. 10/01/15   Mancel BaleElliott Leam Madero, MD  fexofenadine-pseudoephedrine (ALLEGRA-D) 60-120 MG 12 hr tablet Take 1 tablet by mouth 2 (two) times daily. 07/17/15   Joni Reiningonald K Smith, PA-C  HYDROcodone-acetaminophen (NORCO) 5-325 MG tablet Take 1 tablet by mouth every 4 (four) hours as needed. 10/01/15   Mancel BaleElliott Margene Cherian,  MD  lidocaine (XYLOCAINE) 2 % solution Use as directed 20 mLs in the mouth or throat as needed for mouth pain. 12/25/14   Evangeline Dakinharles M Beers, PA-C  naproxen (NAPROSYN) 500 MG tablet Take 1 tablet (500 mg total) by mouth 2 (two) times daily with a meal. 02/04/13   Eber HongBrian Miller, MD  naproxen (NAPROSYN) 500 MG tablet Take 1 tablet (500 mg total) by mouth 2 (two) times daily with a meal. 07/17/15   Joni Reiningonald K Smith, PA-C  oxyCODONE-acetaminophen (ROXICET) 5-325 MG per tablet Take 1-2 tablets by mouth every 4 (four) hours as needed for severe pain. 12/25/14   Evangeline Dakinharles M Beers, PA-C  oxyCODONE-acetaminophen (ROXICET) 5-325 MG tablet Take 1 tablet by mouth every 4 (four) hours as needed for severe pain. 07/17/15   Joni Reiningonald K Smith, PA-C  sulfamethoxazole-trimethoprim (BACTRIM DS,SEPTRA DS) 800-160 MG tablet Take 1 tablet by mouth 2 (two) times daily. 07/17/15   Joni Reiningonald K Smith, PA-C   BP 154/79 mmHg  Pulse 68  Temp(Src) 97.7 F (36.5 C) (Oral)  Resp 16  Ht 5\' 6"  (1.676 m)  Wt 135 lb (61.236 kg)  BMI 21.80 kg/m2  SpO2 100% Physical Exam  Constitutional: He is oriented to person, place, and time. He appears well-developed and well-nourished. No distress.  HENT:  Head: Normocephalic and atraumatic.  Right Ear: External ear normal.  Left Ear: External ear normal.  Eyes: Conjunctivae and EOM are normal. Pupils are equal, round, and reactive to light.  Neck: Normal range of motion  and phonation normal. Neck supple.  Cardiovascular: Normal rate, regular rhythm and normal heart sounds.   Pulmonary/Chest: Effort normal and breath sounds normal. He exhibits no bony tenderness.  Abdominal: Soft. There is no tenderness.  Genitourinary:  Circumcised. Penis is normal. No urethral discharge. Scrotal contents evaluated. There is no deformity or swelling. Testicles are palpated, and are nontender to palpation. There is mild tenderness, posterior to the right testicle without palpable mass or deformity.  Musculoskeletal:  Normal range of motion.  Neurological: He is alert and oriented to person, place, and time. No cranial nerve deficit or sensory deficit. He exhibits normal muscle tone. Coordination normal.  Skin: Skin is warm, dry and intact.  Psychiatric: He has a normal mood and affect. His behavior is normal. Judgment and thought content normal.  Nursing note and vitals reviewed.   ED Course  Procedures (including critical care time)  Medications  cefTRIAXone (ROCEPHIN) injection 250 mg (not administered)  doxycycline (VIBRA-TABS) tablet 100 mg (not administered)  HYDROcodone-acetaminophen (NORCO/VICODIN) 5-325 MG per tablet 1 tablet (1 tablet Oral Given 10/01/15 1509)    Patient Vitals for the past 24 hrs:  BP Temp Temp src Pulse Resp SpO2 Height Weight  10/01/15 1433 154/79 mmHg 97.7 F (36.5 C) Oral 68 16 100 %  (1.676 m) 135 lb (61.236 kg)    4:45 PM Reevaluation with update and discussion. After initial assessment and treatment, an updated evaluation reveals he states he is feeling better at this time. Findings discussed with the patient and all questions were answered. [name]    Labs Review Labs Reviewed  URINALYSIS, ROUTINE W REFLEX MICROSCOPIC (NOT AT [facility]) - Abnormal; Notable for the following:    Specific Gravity, Urine <1.005 (*)    All other components within normal limits    Imaging Review No results found. I have personally reviewed and evaluated these images and lab results as part of my medical decision-making.   EKG Interpretation None      MDM   Final diagnoses:  Epididymitis    Evaluation consistent with epididymitis. Doubt orchitis, or testicular torsion. No evidence for hernia, systemic illness or suspected metabolic instability.  Nursing Notes Reviewed/ Care Coordinated Applicable Imaging Reviewed Interpretation of Laboratory Data incorporated into ED treatment  The patient appears reasonably screened and/or stabilized for discharge and I  doubt any other medical condition or other [facility] requiring further screening, evaluation, or treatment in the ED at this time prior to discharge.  Plan: Home Medications- Doxy, Norco; Home Treatments- rest, scrotal support; return here if the recommended treatment, does not improve the symptoms; Recommended follow up- PCP prn     Mancel Bale, MD 10/01/15 1656

## 2015-10-01 NOTE — ED Notes (Addendum)
Patient c/o pain in right testicle that started yesterday. Denies any injury. Denies any swelling, urinary symptoms, or discharge.

## 2015-10-01 NOTE — Discharge Instructions (Signed)
Epididymitis °Epididymitis is swelling (inflammation) of the epididymis. The epididymis is a cord-like structure that is located along the top and back part of the testicle. It collects and stores sperm from the testicle. °This condition can also cause pain and swelling of the testicle and scrotum. Symptoms usually start suddenly (acute epididymitis). Sometimes epididymitis starts gradually and lasts for a while (chronic epididymitis). This type may be harder to treat. °CAUSES °In men 35 and younger, this condition is usually caused by a bacterial infection or sexually transmitted disease (STD), such as: °· Gonorrhea. °· Chlamydia.   °In men 35 and older who do not have anal sex, this condition is usually caused by bacteria from a blockage or abnormalities in the urinary system. These can result from: °· Having a tube placed into the bladder (urinary catheter). °· Having an enlarged or inflamed prostate gland. °· Having recent urinary tract surgery. °In men who have a condition that weakens the body's defense system (immune system), such as HIV, this condition can be caused by:  °· Other bacteria, including tuberculosis and syphilis. °· Viruses. °· Fungi. °Sometimes this condition occurs without infection. That may happen if urine flows backward into the epididymis after heavy lifting or straining. °RISK FACTORS °This condition is more likely to develop in men: °· Who have unprotected sex with more than one partner. °· Who have anal sex.   °· Who have recently had surgery.   °· Who have a urinary catheter. °· Who have urinary problems. °· Who have a suppressed immune system.   °SYMPTOMS  °This condition usually begins suddenly with chills, fever, and pain behind the scrotum and in the testicle. Other symptoms include:  °· Swelling of the scrotum, testicle, or both. °· Pain when ejaculating or urinating. °· Pain in the back or belly. °· Nausea. °· Itching and discharge from the penis. °· Frequent need to pass  urine. °· Redness and tenderness of the scrotum. °DIAGNOSIS °Your health care provider can diagnose this condition based on your symptoms and medical history. Your health care provider will also do a physical exam to ask about your symptoms and check your scrotum and testicle for swelling, pain, and redness. You may also have other tests, including:   °· Examination of discharge from the penis. °· Urine tests for infections, such as STDs.   °Your health care provider may test you for other STDs, including HIV.  °TREATMENT °Treatment for this condition depends on the cause. If your condition is caused by a bacterial infection, oral antibiotic medicine may be prescribed. If the bacterial infection has spread to your blood, you may need to receive IV antibiotics. Nonbacterial epididymitis is treated with home care that includes bed rest and elevation of the scrotum. °Surgery may be needed to treat: °· Bacterial epididymitis that causes pus to build up in the scrotum (abscess). °· Chronic epididymitis that has not responded to other treatments. °HOME CARE INSTRUCTIONS °Medicines  °· Take over-the-counter and prescription medicines only as told by your health care provider.   °· If you were prescribed an antibiotic medicine, take it as told by your health care provider. Do not stop taking the antibiotic even if your condition improves. °Sexual Activity  °· If your epididymitis was caused by an STD, avoid sexual activity until your treatment is complete. °· Inform your sexual partner or partners if you test positive for an STD. They may need to be treated. Do not engage in sexual activity with your partner or partners until their treatment is completed. °General Instructions  °· Return to your normal activities as told   by your health care provider. Ask your health care provider what activities are safe for you. °· Keep your scrotum elevated and supported while resting. Ask your health care provider if you should wear a  scrotal support, such as a jockstrap. Wear it as told by your health care provider. °· If directed, apply ice to the affected area:   °¨ Put ice in a plastic bag. °¨ Place a towel between your skin and the bag. °¨ Leave the ice on for 20 minutes, 2-3 times per day. °· Try taking a sitz bath to help with discomfort. This is a warm water bath that is taken while you are sitting down. The water should only come up to your hips and should cover your buttocks. Do this 3-4 times per day or as told by your health care provider. °· Keep all follow-up visits as told by your health care provider. This is important. °SEEK MEDICAL CARE IF:  °· You have a fever.   °· Your pain medicine is not helping.   °· Your pain is getting worse.   °· Your symptoms do not improve within three days. °  °This information is not intended to replace advice given to you by your health care provider. Make sure you discuss any questions you have with your health care provider. °  °Document Released: 07/05/2000 Document Revised: 03/29/2015 Document Reviewed: 11/23/2014 °Elsevier Interactive Patient Education ©2016 [employer] ° °

## 2015-10-16 ENCOUNTER — Emergency Department: Payer: BLUE CROSS/BLUE SHIELD

## 2015-10-16 ENCOUNTER — Emergency Department
Admission: EM | Admit: 2015-10-16 | Discharge: 2015-10-16 | Disposition: A | Discharge status: Home / Self Care | Payer: BLUE CROSS/BLUE SHIELD | Attending: Emergency Medicine | Admitting: Emergency Medicine

## 2015-10-16 DIAGNOSIS — F17210 Nicotine dependence, cigarettes, uncomplicated: Secondary | ICD-10-CM | POA: Diagnosis not present

## 2015-10-16 DIAGNOSIS — N50812 Left testicular pain: Secondary | ICD-10-CM

## 2015-10-16 DIAGNOSIS — Z79899 Other long term (current) drug therapy: Secondary | ICD-10-CM | POA: Diagnosis not present

## 2015-10-16 DIAGNOSIS — Z792 Long term (current) use of antibiotics: Secondary | ICD-10-CM | POA: Diagnosis not present

## 2015-10-16 DIAGNOSIS — N50819 Testicular pain, unspecified: Secondary | ICD-10-CM

## 2015-10-16 MED ORDER — NAPROXEN 500 MG PO TABS: 500.0000 mg | ORAL_TABLET | Freq: Two times a day (BID) | ORAL | Status: DC

## 2015-10-16 NOTE — ED Notes (Signed)
Patient states onset yesterday of left sided testicular pain without known trauma. States it just started hurting. Denies dysuria, urgency or frequency. Denies back pain.

## 2015-10-16 NOTE — ED Notes (Signed)
Pt discharged to home.  Discharge instructions reviewed.  Verbalized understanding.  No questions or concerns at this time.  Teach back verified.  Pt in NAD.  No items left in ED.   

## 2015-10-16 NOTE — ED Provider Notes (Signed)
[facility] Emergency Department Provider Note  ____________________________________________  Time seen: Approximately 7:56 PM  I have reviewed the triage vital signs and the nursing notes.   HISTORY  Chief Complaint Testicle Pain    HPI Nicolas Bowers is a 30 y.o. male patient complaining of left testicular pain onset yesterday. Patient denies any history of trauma. Patient denies any urinary complaints. States no back pain. Patient was seen 15 days ago for complain of right testicle pain. Patient was diagnosed and treated for epididymitis. Patient rated his pain as a 7/10. Patient state he was told to follow-up with PCP but he does not have a family doctor.  History reviewed. No pertinent past medical history.  There are no active problems to display for this patient.   Past Surgical History  Procedure Laterality Date  . Hernia repair      Current Outpatient Rx  Name  Route  Sig  Dispense  Refill  . amoxicillin (AMOXIL) 500 MG tablet   Oral   Take 1 tablet (500 mg total) by mouth 3 (three) times daily.   30 tablet   0   . doxycycline (VIBRAMYCIN) 100 MG capsule   Oral   Take 1 capsule (100 mg total) by mouth 2 (two) times daily.   20 capsule   0   . fexofenadine-pseudoephedrine (ALLEGRA-D) 60-120 MG 12 hr tablet   Oral   Take 1 tablet by mouth 2 (two) times daily.   30 tablet   0   . HYDROcodone-acetaminophen (NORCO) 5-325 MG tablet   Oral   Take 1 tablet by mouth every 4 (four) hours as needed.   15 tablet   0   . ibuprofen (ADVIL,MOTRIN) 200 MG tablet   Oral   Take 800 mg by mouth every 6 (six) hours as needed for pain.          Marland Kitchen. lidocaine (XYLOCAINE) 2 % solution   Mouth/Throat   Use as directed 20 mLs in the mouth or throat as needed for mouth pain.   100 mL   0   . naproxen (NAPROSYN) 500 MG tablet   Oral   Take 1 tablet (500 mg total) by mouth 2 (two) times daily with a meal.   30 tablet   0   . naproxen  (NAPROSYN) 500 MG tablet   Oral   Take 1 tablet (500 mg total) by mouth 2 (two) times daily with a meal.   20 tablet   0   . oxyCODONE-acetaminophen (ROXICET) 5-325 MG per tablet   Oral   Take 1-2 tablets by mouth every 4 (four) hours as needed for severe pain.   15 tablet   0   . oxyCODONE-acetaminophen (ROXICET) 5-325 MG tablet   Oral   Take 1 tablet by mouth every 4 (four) hours as needed for severe pain.   20 tablet   0   . sulfamethoxazole-trimethoprim (BACTRIM DS,SEPTRA DS) 800-160 MG tablet   Oral   Take 1 tablet by mouth 2 (two) times daily.   20 tablet   0     Allergies Ultram  No family history on file.  Social History Social History  Substance Use Topics  . Smoking status: Current Every Day Smoker -- 1.00 packs/day for 12 years    Types: Cigarettes  . Smokeless tobacco: Never Used  . Alcohol Use: No    Review of Systems Constitutional: No fever/chills Eyes: No visual changes. ENT: No sore throat. Cardiovascular: Denies chest pain.  Respiratory: Denies shortness of breath. Gastrointestinal: No abdominal pain.  No nausea, no vomiting.  No diarrhea.  No constipation. Genitourinary: Negative for dysuria.Positive for left testicle pain. Musculoskeletal: Negative for back pain. Skin: Negative for rash. Neurological: Negative for headaches, focal weakness or numbness. Allergic/Immunilogical: Ultram 10-point ROS otherwise negative.  ____________________________________________   PHYSICAL EXAM:  VITAL SIGNS: ED Triage Vitals  Enc Vitals Group     BP 10/16/15 1912 135/88 mmHg     Pulse Rate 10/16/15 1912 74     Resp 10/16/15 1912 20     Temp 10/16/15 1912 98.4 F (36.9 C)     Temp Source 10/16/15 1912 Oral     SpO2 10/16/15 1912 99 %     Weight 10/16/15 1912 135 lb (61.236 kg)     Height 10/16/15 1912  (1.676 m)     Head Cir --      Peak Flow --      Pain Score 10/16/15 1914 7     Pain Loc --      Pain Edu? --      Excl. in GC? --      Constitutional: Alert and oriented. Well appearing and in no acute distress. Eyes: Conjunctivae are normal. PERRL. EOMI. Head: Atraumatic. Nose: No congestion/rhinnorhea. Mouth/Throat: Mucous membranes are moist.  Oropharynx non-erythematous. Neck: No stridor.  No cervical spine tenderness to palpation. Hematological/Lymphatic/Immunilogical: No cervical lymphadenopathy. Cardiovascular: Normal rate, regular rhythm. Grossly normal heart sounds.  Good peripheral circulation. Respiratory: Normal respiratory effort.  No retractions. Lungs CTAB. Gastrointestinal: Soft and nontender. No distention. No abdominal bruits. No CVA tenderness. Genitourinary: No obvious edema. Patient has some mild guarding palpation posterior left scrotum. Musculoskeletal: No lower extremity tenderness nor edema.  No joint effusions. Neurologic:  Normal speech and language. No gross focal neurologic deficits are appreciated. No gait instability. Skin:  Skin is warm, dry and intact. No rash noted. Psychiatric: Mood and affect are normal. Speech and behavior are normal.  ____________________________________________   LABS (all labs ordered are listed, but only abnormal results are displayed)  Labs Reviewed - No data to display ____________________________________________  EKG   ____________________________________________  RADIOLOGY  Ultrasound of the testicles and scrotum revealed small bilateral hydroceles small left epidural(but no evidence of testicular mass or torsion. Patient given a prescription for naproxen. ____________________________________________   PROCEDURES  Procedure(s) performed: None  Critical Care performed: No  ____________________________________________   INITIAL IMPRESSION / ASSESSMENT AND PLAN / ED COURSE  Pertinent labs & imaging results that were available during my care of the patient were reviewed by me and considered in my medical decision making (see chart for  details). Left testicular pain. Discuss ultrasound findings with patient. Advised patient to follow-up with urology for further evaluation. ____________________________________________   FINAL CLINICAL IMPRESSION(S) / ED DIAGNOSES  Final diagnoses:  Left testicular pain      Joni Reining, PA-C 10/16/15 2035  Myrna Blazer, MD 10/16/15 2039

## 2015-10-16 NOTE — Discharge Instructions (Signed)
Follow-up with urology for definitive evaluation and treatment. 

## 2015-11-25 ENCOUNTER — Emergency Department
Admission: EM | Admit: 2015-11-25 | Discharge: 2015-11-25 | Disposition: A | Discharge status: Home / Self Care | Payer: BLUE CROSS/BLUE SHIELD | Attending: Emergency Medicine | Admitting: Emergency Medicine

## 2015-11-25 DIAGNOSIS — S025XXA Fracture of tooth (traumatic), initial encounter for closed fracture: Secondary | ICD-10-CM | POA: Diagnosis not present

## 2015-11-25 DIAGNOSIS — K047 Periapical abscess without sinus: Secondary | ICD-10-CM

## 2015-11-25 DIAGNOSIS — Y999 Unspecified external cause status: Secondary | ICD-10-CM | POA: Insufficient documentation

## 2015-11-25 DIAGNOSIS — K032 Erosion of teeth: Secondary | ICD-10-CM

## 2015-11-25 DIAGNOSIS — K0889 Other specified disorders of teeth and supporting structures: Secondary | ICD-10-CM | POA: Diagnosis present

## 2015-11-25 DIAGNOSIS — F17210 Nicotine dependence, cigarettes, uncomplicated: Secondary | ICD-10-CM | POA: Insufficient documentation

## 2015-11-25 DIAGNOSIS — Y939 Activity, unspecified: Secondary | ICD-10-CM | POA: Diagnosis not present

## 2015-11-25 DIAGNOSIS — Z791 Long term (current) use of non-steroidal anti-inflammatories (NSAID): Secondary | ICD-10-CM | POA: Insufficient documentation

## 2015-11-25 DIAGNOSIS — K029 Dental caries, unspecified: Secondary | ICD-10-CM

## 2015-11-25 DIAGNOSIS — Y929 Unspecified place or not applicable: Secondary | ICD-10-CM | POA: Insufficient documentation

## 2015-11-25 DIAGNOSIS — X58XXXA Exposure to other specified factors, initial encounter: Secondary | ICD-10-CM | POA: Diagnosis not present

## 2015-11-25 MED ORDER — MAGIC MOUTHWASH W/LIDOCAINE: 5.0000 mL | Freq: Four times a day (QID) | ORAL | Status: DC

## 2015-11-25 MED ORDER — AMOXICILLIN 875 MG PO TABS: 875.0000 mg | ORAL_TABLET | Freq: Two times a day (BID) | ORAL | Status: DC

## 2015-11-25 NOTE — ED Notes (Signed)
Pt reports to ED w/ c/o dental pain r/t tooth abscess he noticed last night.  NAD.

## 2015-11-25 NOTE — ED Provider Notes (Signed)
[facility] Emergency Department Provider Note  ____________________________________________  Time seen: Approximately 8:09 PM  I have reviewed the triage vital signs and the nursing notes.   HISTORY  Chief Complaint Dental Pain    HPI Nicolas Bowers is a 30 y.o. male who presents emergency department complaining of right upper dental pain. Patient states that he has known poor dentition and has a "abscess" to one of his teeth. Patient is denying any fevers or chills, difficulty breathing or swallowing, chest pain, shortness breath, nausea or vomiting. Patient does not have a dentist. Patient reports that the pain is sharp, constant, unrelieved by anything.   History reviewed. No pertinent past medical history.  There are no active problems to display for this patient.   Past Surgical History  Procedure Laterality Date  . Hernia repair      Current Outpatient Rx  Name  Route  Sig  Dispense  Refill  . ibuprofen (ADVIL,MOTRIN) 200 MG tablet   Oral   Take 800 mg by mouth every 6 (six) hours as needed for pain.          Marland Kitchen amoxicillin (AMOXIL) 875 MG tablet   Oral   Take 1 tablet (875 mg total) by mouth 2 (two) times daily.   14 tablet   0   . doxycycline (VIBRAMYCIN) 100 MG capsule   Oral   Take 1 capsule (100 mg total) by mouth 2 (two) times daily.   20 capsule   0   . fexofenadine-pseudoephedrine (ALLEGRA-D) 60-120 MG 12 hr tablet   Oral   Take 1 tablet by mouth 2 (two) times daily.   30 tablet   0   . HYDROcodone-acetaminophen (NORCO) 5-325 MG tablet   Oral   Take 1 tablet by mouth every 4 (four) hours as needed.   15 tablet   0   . lidocaine (XYLOCAINE) 2 % solution   Mouth/Throat   Use as directed 20 mLs in the mouth or throat as needed for mouth pain.   100 mL   0   . magic mouthwash w/lidocaine SOLN   Oral   Take 5 mLs by mouth 4 (four) times daily.   240 mL   0     Dispense in a 1/1/1/1 ratio. Use lidocaine, diphen  ...   . naproxen (NAPROSYN) 500 MG tablet   Oral   Take 1 tablet (500 mg total) by mouth 2 (two) times daily with a meal.   30 tablet   0   . naproxen (NAPROSYN) 500 MG tablet   Oral   Take 1 tablet (500 mg total) by mouth 2 (two) times daily with a meal.   20 tablet   0   . naproxen (NAPROSYN) 500 MG tablet   Oral   Take 1 tablet (500 mg total) by mouth 2 (two) times daily with a meal.   20 tablet   00   . oxyCODONE-acetaminophen (ROXICET) 5-325 MG per tablet   Oral   Take 1-2 tablets by mouth every 4 (four) hours as needed for severe pain.   15 tablet   0   . oxyCODONE-acetaminophen (ROXICET) 5-325 MG tablet   Oral   Take 1 tablet by mouth every 4 (four) hours as needed for severe pain.   20 tablet   0   . sulfamethoxazole-trimethoprim (BACTRIM DS,SEPTRA DS) 800-160 MG tablet   Oral   Take 1 tablet by mouth 2 (two) times daily.   20 tablet  0     Allergies Ultram  No family history on file.  Social History Social History  Substance Use Topics  . Smoking status: Current Every Day Smoker -- 1.00 packs/day for 12 years    Types: Cigarettes  . Smokeless tobacco: Never Used  . Alcohol Use: No     Review of Systems  Constitutional: No fever/chills ENT: No upper respiratory complaints.Positive for right lower dental pain. Cardiovascular: no chest pain. Respiratory: no cough. No SOB. Musculoskeletal: Negative for musculoskeletal pain. Skin: Negative for rash, abrasions, lacerations, ecchymosis. Neurological: Negative for headaches, focal weakness or numbness. 10-point ROS otherwise negative.  ____________________________________________   PHYSICAL EXAM:  VITAL SIGNS: ED Triage Vitals  Enc Vitals Group     BP 11/25/15 1843 135/81 mmHg     Pulse Rate 11/25/15 1843 82     Resp 11/25/15 1843 16     Temp 11/25/15 1843 98.1 F (36.7 C)     Temp Source 11/25/15 1843 Oral     SpO2 11/25/15 1843 99 %     Weight 11/25/15 1843 135 lb (61.236 kg)      Height 11/25/15 1843 5\' 6"  (1.676 m)     Head Cir --      Peak Flow --      Pain Score 11/25/15 1842 8     Pain Loc --      Pain Edu? --      Excl. in GC? --      Constitutional: Alert and oriented. Well appearing and in no acute distress. Eyes: Conjunctivae are normal. PERRL. EOMI. Head: Atraumatic. ENT:      Ears:       Nose: No congestion/rhinnorhea.      Mouth/Throat: Mucous membranes are moist. Multiple dental caries and erosions are noted throughout dentition. Patient has a fractured tooth to tooth #29. Multiple erosions and caries noted to some 30 through 32. Surrounding erythema and mild edema. Area is tender to palpation with tongue depressor. No fluctuance noted with palpation with tongue depressor. No drainage noted. Neck: No stridor.    Cardiovascular: Normal rate, regular rhythm. Normal S1 and S2.  Good peripheral circulation. Respiratory: Normal respiratory effort without tachypnea or retractions. Lungs CTAB. Good air entry to the bases with no decreased or absent breath sounds. Musculoskeletal: Full range of motion to all extremities. No gross deformities appreciated. Neurologic:  Normal speech and language. No gross focal neurologic deficits are appreciated.  Skin:  Skin is warm, dry and intact. No rash noted. Psychiatric: Mood and affect are normal. Speech and behavior are normal. Patient exhibits appropriate insight and judgement.   ____________________________________________   LABS (all labs ordered are listed, but only abnormal results are displayed)  Labs Reviewed - No data to display ____________________________________________  EKG   ____________________________________________  RADIOLOGY   No results found.  ____________________________________________    PROCEDURES  Procedure(s) performed:       Medications - No data to display   ____________________________________________   INITIAL IMPRESSION / ASSESSMENT AND PLAN / ED  COURSE  Pertinent labs & imaging results that were available during my care of the patient were reviewed by me and considered in my medical decision making (see chart for details).  Patient's diagnosis is consistent with dental infection with dental caries, erosions, and fractures. Patient declines dental block in the emergency department.. Patient will be discharged home with prescriptions for antibiotics and Magic mouthwash. Patient is given list of dental options to follow-up with.. Patient is to follow up with  [facility] or other dental option as listed in paperwork as needed or otherwise directed. Patient is given ED precautions to return to the ED for any worsening or new symptoms.     ____________________________________________  FINAL CLINICAL IMPRESSION(S) / ED DIAGNOSES  Final diagnoses:  Dental infection  Dental caries  Dental erosion  Broken tooth, closed, initial encounter      NEW MEDICATIONS STARTED DURING THIS VISIT:  New Prescriptions   AMOXICILLIN (AMOXIL) 875 MG TABLET    Take 1 tablet (875 mg total) by mouth 2 (two) times daily.   MAGIC MOUTHWASH W/LIDOCAINE SOLN    Take 5 mLs by mouth 4 (four) times daily.        This chart was dictated using voice recognition software/Dragon. Despite best efforts to proofread, errors can occur which can change the meaning. Any change was purely unintentional.    Racheal Patches, PA-C 11/25/15 2019  Emily Filbert, MD 11/25/15 2029

## 2015-11-25 NOTE — Discharge Instructions (Signed)
Dental Abscess °A dental abscess is a collection of pus in or around a tooth. °CAUSES °This condition is caused by a bacterial infection around the root of the tooth that involves the inner part of the tooth (pulp). It may result from: °· Severe tooth decay. °· Trauma to the tooth that allows bacteria to enter into the pulp, such as a broken or chipped tooth. °· Severe gum disease around a tooth. °SYMPTOMS °Symptoms of this condition include: °· Severe pain in and around the infected tooth. °· Swelling and redness around the infected tooth, in the mouth, or in the face. °· Tenderness. °· Pus drainage. °· Bad breath. °· Bitter taste in the mouth. °· Difficulty swallowing. °· Difficulty opening the mouth. °· Nausea. °· Vomiting. °· Chills. °· Swollen neck glands. °· Fever. °DIAGNOSIS °This condition is diagnosed with examination of the infected tooth. During the exam, your dentist may tap on the infected tooth. Your dentist will also ask about your medical and dental history and may order X-rays. °TREATMENT °This condition is treated by eliminating the infection. This may be done with: °· Antibiotic medicine. °· A root canal. This may be performed to save the tooth. °· Pulling (extracting) the tooth. This may also involve draining the abscess. This is done if the tooth cannot be saved. °HOME CARE INSTRUCTIONS °· Take medicines only as directed by your dentist. °· If you were prescribed antibiotic medicine, finish all of it even if you start to feel better. °· Rinse your mouth (gargle) often with salt water to relieve pain or swelling. °· Do not drive or operate heavy machinery while taking pain medicine. °· Do not apply heat to the outside of your mouth. °· Keep all follow-up visits as directed by your dentist. This is important. °SEEK MEDICAL CARE IF: °· Your pain is worse and is not helped by medicine. °SEEK IMMEDIATE MEDICAL CARE IF: °· You have a fever or chills. °· Your symptoms suddenly get worse. °· You have a  very bad headache. °· You have problems breathing or swallowing. °· You have trouble opening your mouth. °· You have swelling in your neck or around your eye. °  °This information is not intended to replace advice given to you by your health care provider. Make sure you discuss any questions you have with your health care provider. °  °Document Released: 07/08/2005 Document Revised: 11/22/2014 Document Reviewed: 07/05/2014 °Elsevier Interactive Patient Education ©2016 Elsevier Inc. ° °Dental Care and Dentist Visits °Dental care supports good overall health. Regular dental visits can also help you avoid dental pain, bleeding, infection, and other more serious health problems in the future. It is important to keep the mouth healthy because diseases in the teeth, gums, and other oral tissues can spread to other areas of the body. Some problems, such as diabetes, heart disease, and pre-term labor have been associated with poor oral health.  °See your dentist every 6 months. If you experience emergency problems such as a toothache or broken tooth, go to the dentist right away. If you see your dentist regularly, you may catch problems early. It is easier to be treated for problems in the early stages.  °WHAT TO EXPECT AT A DENTIST VISIT  °Your dentist will look for many common oral health problems and recommend proper treatment. At your regular dental visit, you can expect: °· Gentle cleaning of the teeth and gums. This includes scraping and polishing. This helps to remove the sticky substance around the teeth and gums (  plaque). Plaque forms in the mouth shortly after eating. Over time, plaque hardens on the teeth as tartar. If tartar is not removed regularly, it can cause problems. Cleaning also helps remove stains. °· Periodic X-rays. These pictures of the teeth and supporting bone will help your dentist assess the health of your teeth. °· Periodic fluoride treatments. Fluoride is a natural mineral shown to help  strengthen teeth. Fluoride treatment involves applying a fluoride gel or varnish to the teeth. It is most commonly done in children. °· Examination of the mouth, tongue, jaws, teeth, and gums to look for any oral health problems, such as: °¨ Cavities (dental caries). This is decay on the tooth caused by plaque, sugar, and acid in the mouth. It is best to catch a cavity when it is small. °¨ Inflammation of the gums caused by plaque buildup (gingivitis). °¨ Problems with the mouth or malformed or misaligned teeth. °¨ Oral cancer or other diseases of the soft tissues or jaws.  °KEEP YOUR TEETH AND GUMS HEALTHY °For healthy teeth and gums, follow these general guidelines as well as your dentist's specific advice: °· Have your teeth professionally cleaned at the dentist every 6 months. °· Brush twice daily with a fluoride toothpaste. °· Floss your teeth daily.  °· Ask your dentist if you need fluoride supplements, treatments, or fluoride toothpaste. °· Eat a healthy diet. Reduce foods and drinks with added sugar. °· Avoid smoking. °TREATMENT FOR ORAL HEALTH PROBLEMS °If you have oral health problems, treatment varies depending on the conditions present in your teeth and gums. °· Your caregiver will most likely recommend good oral hygiene at each visit. °· For cavities, gingivitis, or other oral health disease, your caregiver will perform a procedure to treat the problem. This is typically done at a separate appointment. Sometimes your caregiver will refer you to another dental specialist for specific tooth problems or for surgery. °SEEK IMMEDIATE DENTAL CARE IF: °· You have pain, bleeding, or soreness in the gum, tooth, jaw, or mouth area. °· A permanent tooth becomes loose or separated from the gum socket. °· You experience a blow or injury to the mouth or jaw area. °  °This information is not intended to replace advice given to you by your health care provider. Make sure you discuss any questions you have with your  health care provider. ° ° °OPTIONS FOR DENTAL FOLLOW UP CARE ° °[place] Department of Health and Human Services - Local Safety Net Dental Clinics °http://www.ncdhhs.gov/dph/oralhealth/services/safetynetclinics.htm °  °[facility] (336-562-3123) ° °[facility] (919-933-9087) ° °[facility] (919-663-1744 ext 237) ° °[facility] (336-570-6415) ° °[facility] (919-968-2025) °This clinic caters to the indigent population and is on a lottery system. °Location: °[facility], [address], [place] °Clinic Hours: °Wednesdays from 6pm - 9pm, patients seen by a lottery system. °For dates, call or go to www.med.[facility].edu/[facility]/patients/Dental-[facility] °Services: °Cleanings, fillings and simple extractions. °Payment Options: °DENTAL WORK IS FREE OF CHARGE. Bring proof of income or support. °Best way to get seen: °Arrive at 5:15 pm - this is a lottery, NOT first come/first serve, so arriving earlier will not increase your chances of being seen. °  °  °[facility] °919-537-3737 °Select option 1 for emergencies °  °Location: °[facility], [address], [place] °Clinic Hours: °No walk-ins accepted - call the day before to schedule an appointment. °Check in times are 9:30 am and 1:30 pm. °Services: °  Simple extractions, temporary fillings, pulpectomy/pulp debridement, uncomplicated abscess drainage. °Payment Options: °PAYMENT IS DUE AT THE TIME OF SERVICE.  Fee is usually $100-200, additional surgical procedures (e.g. abscess drainage) may be extra. °Cash, checks, Visa/MasterCard accepted.  Can file Medicaid if patient is covered for dental - patient should call case worker to check. °No discount for [facility] patients. °Best way to get seen: °MUST call the day before and get onto the schedule. Can usually be seen the next 1-2 days. No walk-ins accepted. °  °  °[facility] °[phone] °  °Location: °[facility], [address], [place] °Clinic Hours: °M, W, Th, F 8am or 1:30pm, Tues 9a or 1:30 - first come/first served. °Services: °Simple extractions, temporary fillings, uncomplicated abscess drainage.  You do not need to be an [place] resident. °Payment Options: °PAYMENT IS DUE AT THE TIME OF SERVICE. °Dental insurance, otherwise sliding scale - bring proof of income or support. °Depending on income and treatment needed, cost is usually $50-200. °Best way to get seen: °Arrive early as it is first come/first served. °  °  °[facility] °919-542-1641 °  °Location: °[address] °Clinic Hours: °Mon-Thu 8a-5p °Services: °Most basic dental services including extractions and fillings. °Payment Options: °PAYMENT IS DUE AT THE TIME OF SERVICE. °Sliding scale, up to 50% off - bring proof if income or support. °Medicaid with dental option accepted. °Best way to get seen: °Call to schedule an appointment, can usually be seen within 2 weeks OR they will try to see walk-ins - show up at 8a or 2p (you may have to wait). °  °  °[facility] °919-245-2435 °[place] RESIDENTS ONLY °  °Location: °[facility], [address], [place], [place] [address] °Clinic Hours: By appointment only. °Monday - Thursday 8am-5pm, Friday 8am-12pm °Services: Cleanings, fillings, extractions. °Payment Options: °PAYMENT IS DUE AT THE TIME OF SERVICE. °Cash, Visa or MasterCard. Sliding scale - $30 minimum per service. °Best way to get seen: °Come in to office, complete packet and make an appointment - need proof of income °or support monies for each household member and proof of [place] residence. °Usually takes about a month to get in. °  °  °[facility] °[phone] °  °Location: °[address] [place] °Clinic Hours: Walk-in Urgent Care Dental Services are  offered Monday-Friday mornings only. °The numbers of emergencies accepted daily is limited to the number of °providers available. °Maximum 15 - Mondays, Wednesdays & Thursdays °Maximum 10 - Tuesdays & Fridays °Services: °You do not need to be a [place] resident to be seen for a dental emergency. °Emergencies are defined as pain, swelling, abnormal bleeding, or dental trauma. Walkins will receive x-rays if needed. °NOTE: Dental cleaning is not an emergency. °Payment Options: °PAYMENT IS DUE AT THE TIME OF SERVICE. °Minimum co-pay is $40.00 for uninsured patients. °Minimum co-pay is $3.00 for Medicaid with dental coverage. °Dental Insurance is accepted and must be presented at time of visit. °Medicare does not cover dental. °Forms of payment: Cash, credit card, checks. °Best way to get seen: °If not previously registered with the clinic, walk-in dental registration begins at 7:15 am and is on a first come/first serve basis. °If previously registered with the clinic, call to make an appointment. °  °  °[facility] °919-776-4359 °[place] RESIDENTS ONLY °  °Location: °[address], [place], [place] °Clinic Hours: °Mon-Thu 10a-2p °Services:   Extractions only! °Payment Options: °FREE (donations accepted) - bring proof of income or support °Best way to get seen: °Call and schedule an appointment OR come at 8am on the 1st Monday of every month (except for holidays) when it is first come/first served. °  °  °[facility] °[phone] °  °Location: °[address], [place] °Clinic Hours: °Friday mornings °Services, Payment Options, Best way to get seen: °Call for info ° ° °  °Document Released: 03/20/2011 Document Revised: 09/30/2011 Document Reviewed: 03/20/2011 °Elsevier Interactive Patient Education ©2016 Elsevier Inc. ° °

## 2015-12-03 ENCOUNTER — Emergency Department (HOSPITAL_COMMUNITY)
Admission: EM | Admit: 2015-12-03 | Discharge: 2015-12-03 | Disposition: A | Discharge status: Home / Self Care | Payer: BLUE CROSS/BLUE SHIELD | Attending: Emergency Medicine | Admitting: Emergency Medicine

## 2015-12-03 ENCOUNTER — Encounter (HOSPITAL_COMMUNITY): Payer: Self-pay | Admitting: Emergency Medicine

## 2015-12-03 DIAGNOSIS — K029 Dental caries, unspecified: Secondary | ICD-10-CM | POA: Diagnosis not present

## 2015-12-03 DIAGNOSIS — F17210 Nicotine dependence, cigarettes, uncomplicated: Secondary | ICD-10-CM | POA: Diagnosis not present

## 2015-12-03 DIAGNOSIS — K0889 Other specified disorders of teeth and supporting structures: Secondary | ICD-10-CM | POA: Diagnosis present

## 2015-12-03 MED ORDER — CLINDAMYCIN HCL 300 MG PO CAPS: 300.0000 mg | ORAL_CAPSULE | Freq: Three times a day (TID) | ORAL | Status: DC

## 2015-12-03 MED ORDER — CLINDAMYCIN HCL 150 MG PO CAPS
300.0000 mg | ORAL_CAPSULE | Freq: Once | ORAL | Status: AC
  Administered 2015-12-03: 300 mg via ORAL
  Filled 2015-12-03: qty 2

## 2015-12-03 MED ORDER — KETOROLAC TROMETHAMINE 60 MG/2ML IM SOLN
60.0000 mg | Freq: Once | INTRAMUSCULAR | Status: DC
  Filled 2015-12-03: qty 2

## 2015-12-03 MED ORDER — NAPROXEN 500 MG PO TABS: 500.0000 mg | ORAL_TABLET | Freq: Two times a day (BID) | ORAL | Status: DC

## 2015-12-03 MED ORDER — HYDROCODONE-ACETAMINOPHEN 5-325 MG PO TABS
1.0000 | ORAL_TABLET | Freq: Once | ORAL | Status: DC
  Filled 2015-12-03: qty 1

## 2015-12-03 NOTE — Discharge Instructions (Signed)
CALL THE NUMBER THAT THEY GAVE YOU AT [facility] TO SCHEDULE FOLLOW UP WITH THE DENTIST. IT IS IMPORTANT THAT YOU FOLLOW UP BEFORE THE INFECTION SPREADS AND YOU GET SICK.  TAKE THE ANTIBIOTICS AS DIRECTED AND THE MEDICATION FOR PAIN AND INFLAMMATION. USE TOPICAL PAIN RELIEF FOR TEETH THAT YOU CAN GET AT THE DRUG STORE.    [employer] [facility] [employer] is a great source of information about community services available.  Access by dialing 2-1-1 from anywhere in [place], or by website -  PooledIncome.pl.   Other Local Resources (Updated 07/2015)  Dental  Care   Services    Phone Number and Address  Cost  [facility] For children 31 - 85 years of age:   Cleaning  Tooth brushing/flossing instruction  Sealants, fillings, crowns  Extractions  Emergency treatment  [phone] [address] [place], [place] [address] Charges based on family income.  Medicaid and some insurance plans accepted.     [facility] - [facility], fillings, crowns  Extractions  Emergency treatment [phone] [address] [place], [place]  Pregnant women 15 years of age or older with a Medicaid card  [facility] [place]  Cleaning  Sealants, fillings, crowns  Extractions  Emergency treatment [phone] [place], [place] Pregnant women 45 years of age or older with a Medicaid card  [facility] - [facility] For children 66 - 60 years of age:   Cleaning  Tooth brushing/flossing instruction  Sealants, fillings, crowns  Extractions  Emergency treatment Limited orthodontic services for patients with Medicaid [phone] [address] [place], [place] [address] Medicaid and [facility] Health Choice cover for children up to age 35 and pregnant women.  Parents of children up to age  83 without Medicaid pay a reduced fee at time of service.  [facility] [employer] For children 109 - 44 years of age:   Cleaning  Tooth brushing/flossing instruction  Sealants, fillings, crowns  Extractions  Emergency treatment Limited orthodontic services for patients with Medicaid [phone] [address] [place], [place].  Medicaid and [place] Health Choice cover for children up to age 78 and pregnant women.  Parents of children up to age 34 without Medicaid pay a reduced fee.  [facility]  Sealants, fillings, crowns  Extractions  Hours: Tuesdays and Thursdays, 4:15 - 8 pm [phone] [address] [place], [place] [address] Services free of charge to [facility] residents ages 18-64 who do not have health insurance, Medicare, [place], or [place] benefits and fall within federal poverty guidelines  [employer]    Provides dental care in addition to primary medical care, nutritional counseling, and pharmacy:  Nurse, mental health, fillings, crowns  Extractions                  [phone] [facility], [address] [place], [place]  [phone] [facility], [address] [place], [place]  [phone] [facility] [place], [place]  [phone] [facility], [address] [place], [place]  [phone] [facility] [address] [place], [place] Accepts [place], [place], most insurance.  Also provides services available to all with fees adjusted based on ability to pay.  [facility]  Cleaning  Tooth brushing/flossing instruction  Sealants, fillings, crowns  Extractions  Emergency treatment Hours: Tuesdays, Thursdays, and Fridays from 8 am to 5 pm by appointment only.  [phone] [address] [place], [place] [address] [facility] residents with Medicaid (depending on eligibility) and children with [facility] Health Choice - call for more information.  [facility]  Extractions only  Hours: 2nd and 4th Thursday of each month from 6:30 am - 9 am.   [phone]. [phone] [place], [place] [address] Ages 7618 and older only.  Patients are seen on a first come, first served basis.  [employer]  [employer]  Fillings  Extractions  Orthodontics  Endodontics  Implants/Crowns/Bridges  Complete and partial dentures [phone] [place], [place] Patients must complete an application for services.  There is often a waiting list.    Dental Caries Dental caries is tooth decay. This decay can cause a hole in teeth (cavity) that can get bigger and deeper over time. HOME CARE  Brush and floss your teeth. Do this at least two times a day.  Use a fluoride toothpaste.  Use a mouth rinse if told by your dentist or doctor.  Eat less sugary and starchy foods. Drink less sugary drinks.  Avoid snacking often on sugary and starchy foods. Avoid sipping often on sugary drinks.  Keep regular checkups and cleanings with your dentist.  Use fluoride supplements if told by your dentist or doctor.  Allow fluoride to be applied to teeth if told by your dentist or doctor.   This information is not intended to replace advice given to you by your health care provider. Make sure you discuss any questions you have with your health care provider.   Document Released: 04/16/2008 Document Revised: 07/29/2014 Document Reviewed: 07/10/2012 Elsevier Interactive Patient Education 2016 [employer]  Dental Care and Dentist Visits Dental care supports good overall health. Regular dental visits can also help you avoid dental pain, bleeding, infection, and other more serious health problems in the future. It is important to keep the mouth healthy because  diseases in the teeth, gums, and other oral tissues can spread to other areas of the body. Some problems, such as diabetes, heart disease, and pre-term labor have been associated with poor oral health.  See your dentist every 6 months. If you experience emergency problems such as a toothache or broken tooth, go to the dentist right away. If you see your dentist regularly, you may catch problems early. It is easier to be treated for problems in the early stages.  WHAT TO EXPECT AT A DENTIST VISIT  Your dentist will look for many common oral health problems and recommend proper treatment. At your regular dental visit, you can expect:  Gentle cleaning of the teeth and gums. This includes scraping and polishing. This helps to remove the sticky substance around the teeth and gums (plaque). Plaque forms in the mouth shortly after eating. Over time, plaque hardens on the teeth as tartar. If tartar is not removed regularly, it can cause problems. Cleaning also helps remove stains.  Periodic X-rays. These pictures of the teeth and supporting bone will help your dentist assess the health of your teeth.  Periodic fluoride treatments. Fluoride is a natural mineral shown to help strengthen teeth. Fluoride treatmentinvolves applying a fluoride gel or varnish to the teeth. It is most commonly done in children.  Examination of the mouth, tongue, jaws, teeth, and gums  to look for any oral health problems, such as:  Cavities (dental caries). This is decay on the tooth caused by plaque, sugar, and acid in the mouth. It is best to catch a cavity when it is small.  Inflammation of the gums caused by plaque buildup (gingivitis).  Problems with the mouth or malformed or misaligned teeth.  Oral cancer or other diseases of the soft tissues or jaws. KEEP YOUR TEETH AND GUMS HEALTHY For healthy teeth and gums, follow these general guidelines as well as your dentist's specific advice:  Have your teeth professionally  cleaned at the dentist every 6 months.  Brush twice daily with a fluoride toothpaste.  Floss your teeth daily.  Ask your dentist if you need fluoride supplements, treatments, or fluoride toothpaste.  Eat a healthy diet. Reduce foods and drinks with added sugar.  Avoid smoking. TREATMENT FOR ORAL HEALTH PROBLEMS If you have oral health problems, treatment varies depending on the conditions present in your teeth and gums.  Your caregiver will most likely recommend good oral hygiene at each visit.  For cavities, gingivitis, or other oral health disease, your caregiver will perform a procedure to treat the problem. This is typically done at a separate appointment. Sometimes your caregiver will refer you to another dental specialist for specific tooth problems or for surgery. SEEK IMMEDIATE DENTAL CARE IF:  You have pain, bleeding, or soreness in the gum, tooth, jaw, or mouth area.  A permanent tooth becomes loose or separated from the gum socket.  You experience a blow or injury to the mouth or jaw area.   This information is not intended to replace advice given to you by your health care provider. Make sure you discuss any questions you have with your health care provider.   Document Released: 03/20/2011 Document Revised: 09/30/2011 Document Reviewed: 03/20/2011 Elsevier Interactive Patient Education 2016 Elsevier Inc.  Dental Pain Dental pain may be caused by many things, including:  Tooth decay (cavities or caries). Cavities cause the nerve of your tooth to be open to air and hot or cold temperatures. This can cause pain or discomfort.  Abscess or infection. A dental abscess is an area that is full of infected pus from a bacterial infection in the inner part of the tooth (pulp). It usually happens at the end of the tooth's root.  Injury.  An unknown reason (idiopathic). Your pain may be mild or severe. It may only happen when:  You are chewing.  You are exposed to hot or  cold temperature.  You are eating or drinking sugary foods or beverages, such as:  Soda.  Candy. Your pain may also be there all of the time. HOME CARE Watch your dental pain for any changes. Do these things to lessen your discomfort:  Take medicines only as told by your dentist.  If your dentist tells you to take an antibiotic medicine, finish all of it even if you start to feel better.  Keep all follow-up visits as told by your dentist. This is important.  Do not apply heat to the outside of your face.  Rinse your mouth or gargle with salt water if told by your dentist. This helps with pain and swelling.  You can make salt water by adding  tsp of salt to 1 cup of warm water.  Apply ice to the painful area of your face:  Put ice in a plastic bag.  Place a towel between your skin and the bag.  Leave the ice on  for 20 minutes, 2-3 times per day.  Avoid foods or drinks that cause you pain, such as:  Very hot or very cold foods or drinks.  Sweet or sugary foods or drinks. GET HELP IF:  Your pain is not helped with medicines.  Your symptoms are worse.  You have new symptoms. GET HELP RIGHT AWAY IF:  You cannot open your mouth.  You are having trouble breathing or swallowing.  You have a fever.  Your face, neck, or jaw is puffy (swollen).   This information is not intended to replace advice given to you by your health care provider. Make sure you discuss any questions you have with your health care provider.   Document Released: 12/25/2007 Document Revised: 11/22/2014 Document Reviewed: 07/04/2014 Elsevier Interactive Patient Education [employer]

## 2015-12-03 NOTE — ED Notes (Signed)
Pt refused injection of toradol.   Initially stated it was ok to give him the [place] his ride was in the waiting room. There was no ride nor was he able to get anyone to come pick him up.   Verbalized understanding of dc instructions prescriptions and follow up care.   * * * GAVE EXTENSIVE TEACHING ON DENTAL CLINICS SINCE HE DID NOT CALL ANY OF THEM FROM PREVIOUS VISIT. HE SAID HE WORKS EVERYDAY AND DOESN'T GET OUT TILL 5PM.  Paperwork from [facility] outlined times available after 5:15pm. Gave him another copy of same, plus additional [place] options. * * *

## 2015-12-03 NOTE — ED Provider Notes (Signed)
CSN: [id number]     Arrival date & time 12/03/15  1733 History  By signing my name below, I, Tanda Rockers, attest that this documentation has been prepared under the direction and in the presence of Kerrie Buffalo, NP. Electronically Signed: Tanda Rockers, ED Scribe. 12/03/2015. 5:57 PM.   Chief Complaint  Patient presents with  . Dental Pain   Patient is a 30 y.o. male presenting with tooth pain. The history is provided by the patient. No language interpreter was used.  Dental Pain Location:  Lower Lower teeth location:  18/LL 2nd molar, 30/RL 1st molar, 32/RL 3rd molar and 19/LL 1st molar Severity:  Severe Onset quality:  Gradual Duration:  2 weeks Timing:  Constant Progression:  Unchanged Chronicity:  New Context: poor dentition   Ineffective treatments: amoxicillin. Associated symptoms: no difficulty swallowing, no drooling, no fever and no trismus   Risk factors: lack of dental care and smoking     HPI Comments: Nicolas Bowers is a 30 y.o. male who presents to the Emergency Department complaining of gradual onset, constant, right lower dental pain x a couple of weeks. Pt was seen at [facility] 1 week ago and was prescribed antibiotics without relief. He states that the pain has since spread to the left lower side as well. He has been unable to follow up with the dental clinic provided by [facility] due to working until past 5 everyday, prompting him to come to the ED today. Pt states that he has been unable to eat or sleep due to the pain. He is tolerating his own secretions. Pt denies fever, chills, or any other associated symptoms.   Per chart review: Pt was seen at [facility] ED on 11/25/2015 (approximately 1 week ago) for right dental pain. He was given a prescription for Amoxicillin and magic mouth wash. Pt was also given a referral to a dental clinic. He declined a dental block at that time.   History reviewed. No pertinent past medical history. Past Surgical History   Procedure Laterality Date  . Hernia repair     No family history on file. Social History  Substance Use Topics  . Smoking status: Current Every Day Smoker -- 1.00 packs/day for 12 years    Types: Cigarettes  . Smokeless tobacco: Never Used  . Alcohol Use: No    Review of Systems  Constitutional: Negative for fever and chills.  HENT: Positive for dental problem. Negative for drooling and trouble swallowing.   Respiratory: Negative for shortness of breath.   All other systems reviewed and are negative.  Allergies  Ultram  Home Medications   Prior to Admission medications   Medication Sig Start Date End Date Taking? Authorizing Provider  clindamycin (CLEOCIN) 300 MG capsule Take 1 capsule (300 mg total) by mouth 3 (three) times daily. 12/03/15   Mellanie Bejarano Orlene Och, NP  magic mouthwash w/lidocaine SOLN Take 5 mLs by mouth 4 (four) times daily. 11/25/15   Delorise Royals Cuthriell, PA-C  naproxen (NAPROSYN) 500 MG tablet Take 1 tablet (500 mg total) by mouth 2 (two) times daily. 12/03/15   Denys Salinger Orlene Och, NP   BP 136/73 mmHg  Pulse 88  Temp(Src) 98.7 F (37.1 C) (Oral)  Resp 16  Ht  (1.676 m)  Wt 58.968 kg  BMI 20.99 kg/m2  SpO2 98%   Physical Exam  Constitutional: He is oriented to person, place, and time. He appears well-developed and well-nourished. No distress.  HENT:  Head: Normocephalic and atraumatic.  Mouth/Throat:  Uvula is midline, oropharynx is clear and moist and mucous membranes are normal. No trismus in the jaw. Dental caries present. No dental abscesses.  Right lower 1st molar is broken and decayed to the gumline. Right 3rd molar is decayed.  The 1st molar on the left is decayed to the gumline. 2nd molar on left is decayed. Gum surrounding the teeth is swollen with erythema.   Eyes: Conjunctivae and EOM are normal.  Neck: Neck supple. No tracheal deviation present.  Bilateral cervical lymphadenopathy  Cardiovascular: Normal rate, regular rhythm and normal heart sounds.    Pulmonary/Chest: Effort normal and breath sounds normal. No respiratory distress. He has no wheezes. He has no rales.  Abdominal: Soft. There is no tenderness.  Musculoskeletal: Normal range of motion.  Lymphadenopathy:    He has cervical adenopathy.  Neurological: He is alert and oriented to person, place, and time.  Skin: Skin is warm and dry.  Psychiatric: He has a normal mood and affect. His behavior is normal.  Nursing note and vitals reviewed.   ED Course  Procedures (including critical care time)  DIAGNOSTIC STUDIES: Oxygen Saturation is 98% on RA, normal by my interpretation.    COORDINATION OF CARE: 5:53 PM-Discussed treatment plan which includes Vicodin 5/325 mg x1 here in the ED, Toradol injection, and Clindamycin with pt at bedside and pt agreed to plan.   MDM  30 y.o. male with dental pain due to caries that was [facility] and [facility] and referred to dental clinic.patient did not f/u and is here with worsening symptoms after finishing the antibiotics. Stable for d/c without trismus, no abscess noted, no difficulty swallowing.  Final diagnoses:  Pain due to dental caries   Patient with dentalgia.  No abscess requiring immediate incision and drainage.  Exam not concerning for Ludwig's angina or pharyngeal abscess.  Will treat with Clindamycin and Naprosyn. Pt instructed to follow-up with dentist and he was instructed on previous visit.  Discussed return precautions. Pt safe for discharge. I personally performed the services described in this documentation, which was scribed in my presence. The recorded information has been reviewed and is accurate.      [address] Vol[address]M Burl Tauzin, [place]NP 12/03/15 1809  Loren Raceravid Yelverton, MD 12/04/15 [phone]

## 2015-12-03 NOTE — ED Notes (Signed)
Patient c/o right and left lower dental pain. Patient [facility] being [facility] for right lower dental pain a week ago in which he was given antibiotics. Per patient pain never relieved completely and now left lower dental pain has started.

## 2015-12-06 ENCOUNTER — Emergency Department
Admission: EM | Admit: 2015-12-06 | Discharge: 2015-12-06 | Disposition: A | Discharge status: Home / Self Care | Payer: BLUE CROSS/BLUE SHIELD | Attending: Emergency Medicine | Admitting: Emergency Medicine

## 2015-12-06 ENCOUNTER — Emergency Department: Payer: BLUE CROSS/BLUE SHIELD

## 2015-12-06 ENCOUNTER — Encounter: Payer: Self-pay | Admitting: Emergency Medicine

## 2015-12-06 DIAGNOSIS — K2970 Gastritis, unspecified, without bleeding: Secondary | ICD-10-CM | POA: Insufficient documentation

## 2015-12-06 DIAGNOSIS — R109 Unspecified abdominal pain: Secondary | ICD-10-CM

## 2015-12-06 DIAGNOSIS — F17210 Nicotine dependence, cigarettes, uncomplicated: Secondary | ICD-10-CM | POA: Insufficient documentation

## 2015-12-06 LAB — CBC
HCT: 47.8 % (ref 40.0–52.0)
HEMOGLOBIN: 16 g/dL (ref 13.0–18.0)
MCH: 27 pg (ref 26.0–34.0)
MCHC: 33.4 g/dL (ref 32.0–36.0)
MCV: 80.8 fL (ref 80.0–100.0)
Platelets: 221 10*3/uL (ref 150–440)
RBC: 5.92 MIL/uL — ABNORMAL HIGH (ref 4.40–5.90)
RDW: 13.8 % (ref 11.5–14.5)
WBC: 6.3 10*3/uL (ref 3.8–10.6)

## 2015-12-06 LAB — URINALYSIS COMPLETE WITH MICROSCOPIC (ARMC ONLY)
Bacteria, UA: NONE SEEN
Bilirubin Urine: NEGATIVE
Glucose, UA: NEGATIVE mg/dL
Hgb urine dipstick: NEGATIVE
KETONES UR: NEGATIVE mg/dL
Leukocytes, UA: NEGATIVE
Nitrite: NEGATIVE
PH: 5 (ref 5.0–8.0)
PROTEIN: 30 mg/dL — AB
Specific Gravity, Urine: 1.032 — ABNORMAL HIGH (ref 1.005–1.030)

## 2015-12-06 LAB — COMPREHENSIVE METABOLIC PANEL WITH GFR
ALT: 27 U/L (ref 17–63)
AST: 41 U/L (ref 15–41)
Albumin: 4.6 g/dL (ref 3.5–5.0)
Alkaline Phosphatase: 95 U/L (ref 38–126)
Anion gap: 10 (ref 5–15)
BUN: 21 mg/dL — ABNORMAL HIGH (ref 6–20)
CO2: 30 mmol/L (ref 22–32)
Calcium: 10 mg/dL (ref 8.9–10.3)
Chloride: 97 mmol/L — ABNORMAL LOW (ref 101–111)
Creatinine, Ser: 1.08 mg/dL (ref 0.61–1.24)
GFR calc Af Amer: >60 mL/min
GFR calc non Af Amer: >60 mL/min
Glucose, Bld: 117 mg/dL — ABNORMAL HIGH (ref 65–99)
Potassium: 4.5 mmol/L (ref 3.5–5.1)
Sodium: 137 mmol/L (ref 135–145)
Total Bilirubin: 0.8 mg/dL (ref 0.3–1.2)
Total Protein: 8.1 g/dL (ref 6.5–8.1)

## 2015-12-06 LAB — LIPASE, BLOOD: Lipase: 19 U/L (ref 11–51)

## 2015-12-06 MED ORDER — BACITRACIN ZINC 500 UNIT/GM EX OINT: TOPICAL_OINTMENT | CUTANEOUS | Status: DC

## 2015-12-06 MED ORDER — FAMOTIDINE IN NACL 20-0.9 MG/50ML-% IV SOLN
20.0000 mg | Freq: Once | INTRAVENOUS | Status: AC
  Administered 2015-12-06: 20 mg via INTRAVENOUS
  Filled 2015-12-06: qty 50

## 2015-12-06 MED ORDER — FAMOTIDINE 20 MG PO TABS: 20.0000 mg | ORAL_TABLET | Freq: Two times a day (BID) | ORAL | Status: DC

## 2015-12-06 MED ORDER — SODIUM CHLORIDE 0.9 % IV BOLUS (SEPSIS)
1000.0000 mL | Freq: Once | INTRAVENOUS | Status: AC
Start: 2015-12-06 — End: 2015-12-06
  Administered 2015-12-06: 1000 mL via INTRAVENOUS

## 2015-12-06 MED ORDER — ONDANSETRON HCL 4 MG PO TABS: 4.0000 mg | ORAL_TABLET | Freq: Every day | ORAL | Status: DC | PRN

## 2015-12-06 NOTE — ED Provider Notes (Addendum)
[facility] Emergency Department Provider Note   ____________________________________________  Time seen: Approximately 1030 AM  I have reviewed the triage vital signs and the nursing notes.   HISTORY  Chief Complaint Nausea and Fatigue   HPI Leia AlfJoshua S Christenson is a 30 y.o. male with a history of a hernia repair was presenting to the emergency department today with left flank pain. He says the pain is associated with nausea and weakness has been ongoing for the past 2 days. He says that the pain is sharp. He says that he vomited on Monday but yesterday was able tolerate hamburger helper. He denies any burning with urination but says he also has a bump to his penis which he thinks may have been a bug bite. He does not have any itching or pain over the lesion.Last sexually active in February. Denies any family history of kidney stones. No known sick contacts. Says was seen recently for an abscessed tooth but that has subsided. He has been prescribed clindamycin.   History reviewed. No pertinent past medical history.  There are no active problems to display for this patient.   Past Surgical History  Procedure Laterality Date  . Hernia repair      Current Outpatient Rx  Name  Route  Sig  Dispense  Refill  . clindamycin (CLEOCIN) 300 MG capsule   Oral   Take 1 capsule (300 mg total) by mouth 3 (three) times daily.   30 capsule   0   . magic mouthwash w/lidocaine SOLN   Oral   Take 5 mLs by mouth 4 (four) times daily.   240 mL   0     Dispense in a 1/1/1/1 ratio. Use lidocaine, diphen ...   . naproxen (NAPROSYN) 500 MG tablet   Oral   Take 1 tablet (500 mg total) by mouth 2 (two) times daily.   20 tablet   0     Allergies Ultram  No family history on file.  Social History Social History  Substance Use Topics  . Smoking status: Current Every Day Smoker -- 1.00 packs/day for 12 years    Types: Cigarettes  . Smokeless tobacco: Never Used    . Alcohol Use: No    Review of Systems Constitutional: No fever/chills Eyes: No visual changes. ENT: No sore throat. Cardiovascular: Denies chest pain. Respiratory: Denies shortness of breath. Gastrointestinal: No diarrhea.  No constipation. Genitourinary: Negative for dysuria. Musculoskeletal: Negative for back pain. Skin: Lesion to penis. Neurological: Negative for headaches, focal weakness or numbness.  10-point ROS otherwise negative.  ____________________________________________   PHYSICAL EXAM:  VITAL SIGNS: ED Triage Vitals  Enc Vitals Group     BP 12/06/15 0807 130/85 mmHg     Pulse Rate 12/06/15 0807 98     Resp 12/06/15 0807 20     Temp 12/06/15 0807 97.7 F (36.5 C)     Temp Source 12/06/15 0807 Oral     SpO2 12/06/15 0807 99 %     Weight 12/06/15 0807 135 lb (61.236 kg)     Height 12/06/15 0807 5\' 6"  (1.676 m)     Head Cir --      Peak Flow --      Pain Score 12/06/15 0824 7     Pain Loc --      Pain Edu? --      Excl. in GC? --     Constitutional: Alert and oriented. Well appearing and in no acute distress. Eyes: Conjunctivae  are normal. PERRL. EOMI. Head: Atraumatic. Nose: No congestion/rhinnorhea. Mouth/Throat: Mucous membranes are moist.   Neck: No stridor.   Cardiovascular: Normal rate, regular rhythm. Grossly normal heart sounds.  Good peripheral circulation. Respiratory: Normal respiratory effort.  No retractions. Lungs CTAB. Gastrointestinal: Soft and nontender. No distention. Left-sided CVA tenderness to palpation. No crepitus or step-off or deformity. Genitourinary:  Circumcised male. On the dorsum of the glans of the penis he has a punctate white lesion surrounded by about a centimeter wheal of erythema which is nontender. It appears as a clogged gland. The erythema is very mild. There is no fluctuance. No pus. There is no ulcerating lesion. Musculoskeletal: No lower extremity tenderness nor edema.  No joint effusions. Neurologic:  Normal  speech and language. No gross focal neurologic deficits are appreciated. No gait instability. Skin:  Skin is warm, dry and intact. No rash noted. Psychiatric: Mood and affect are normal. Speech and behavior are normal.  ____________________________________________   LABS (all labs ordered are listed, but only abnormal results are displayed)  Labs Reviewed  COMPREHENSIVE METABOLIC PANEL - Abnormal; Notable for the following:    Chloride 97 (*)    Glucose, Bld 117 (*)    BUN 21 (*)    All other components within normal limits  CBC - Abnormal; Notable for the following:    RBC 5.92 (*)    All other components within normal limits  URINALYSIS COMPLETEWITH MICROSCOPIC ([facility] ONLY) - Abnormal; Notable for the following:    Color, Urine YELLOW (*)    APPearance CLEAR (*)    Specific Gravity, Urine 1.032 (*)    Protein, ur 30 (*)    Squamous Epithelial / LPF 0-5 (*)    All other components within normal limits  LIPASE, BLOOD   ____________________________________________  EKG   ____________________________________________  RADIOLOGY   CT Renal Stone Study (Final result) Result time: 12/06/15 10:45:15   Final result by Rad Results In Interface (12/06/15 10:45:15)   Narrative:   CLINICAL DATA: Left flank pain  EXAM: CT ABDOMEN AND PELVIS WITHOUT CONTRAST  TECHNIQUE: Multidetector CT imaging of the abdomen and pelvis was performed following the standard protocol without IV contrast.  COMPARISON: None.  FINDINGS: Lower chest and abdominal wall: No contributory findings.  Hepatobiliary: No focal liver abnormality.No evidence of biliary obstruction or stone.  Pancreas: Unremarkable.  Spleen: Unremarkable.  Adrenals/Urinary Tract: Negative adrenals. Pelvic vascular calcifications. No hydronephrosis or stone. Unremarkable bladder.  Reproductive:No pathologic findings.  Stomach/Bowel: No obstruction. No inflammatory changes.  Vascular/Lymphatic: No acute  vascular finding. No mass or adenopathy.  Peritoneal: No ascites or pneumoperitoneum.  Musculoskeletal: Chronic bilateral L5 pars defects with mild anterolisthesis.  IMPRESSION: 1. No acute finding. No hydronephrosis or urolithiasis. 2. 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   Electronically Signed By: Marnee Spring M.D. On: 12/06/2015 10:45    ____________________________________________   PROCEDURES   ____________________________________________   INITIAL IMPRESSION / ASSESSMENT AND PLAN / ED COURSE  Pertinent labs & imaging results that were available during my care of the patient were reviewed by me and considered in my medical decision making (see chart for details).  ----------------------------------------- 1:10 PM on 12/06/2015 -----------------------------------------  Patient says pain relieved after IV fluids and Pepcid. Possible that he has gastritis. We'll discharge with prescription for Zantac. Says that he is not aware of any injury but he did have left flank pain and I recommend that he try a muscle cream such as Aspercreme or icy hot. He'll be discharged home.  He understands the plan. Very reassuring labs as well as a very reassuring CAT scan of his abdomen and pelvis. We'll also discharge with nausea meds. ____________________________________________   FINAL CLINICAL IMPRESSION(S) / ED DIAGNOSES  Left flank pain. Gastritis.    NEW MEDICATIONS STARTED DURING THIS VISIT:  New Prescriptions   No medications on file     Note:  This document was prepared using Dragon voice recognition software and may include unintentional dictation errors.    Myrna Blazer, MD 12/06/15 1312  Discussed with patient the use of warm compresses as well as antibiotic ointment to the penile lesion. Does not appear as STD.  Myrna Blazer, MD 12/06/15 1318

## 2015-12-06 NOTE — ED Notes (Signed)
Nausea , weakness x2 days , also noticed this morning a possible bug bite to the head of his penis. redness noted , no new sexual partners since end of February

## 2016-01-17 ENCOUNTER — Emergency Department
Admission: EM | Admit: 2016-01-17 | Discharge: 2016-01-17 | Disposition: A | Discharge status: Home / Self Care | Payer: BLUE CROSS/BLUE SHIELD | Attending: Emergency Medicine | Admitting: Emergency Medicine

## 2016-01-17 DIAGNOSIS — Z79899 Other long term (current) drug therapy: Secondary | ICD-10-CM | POA: Diagnosis not present

## 2016-01-17 DIAGNOSIS — Z791 Long term (current) use of non-steroidal anti-inflammatories (NSAID): Secondary | ICD-10-CM | POA: Insufficient documentation

## 2016-01-17 DIAGNOSIS — K029 Dental caries, unspecified: Secondary | ICD-10-CM | POA: Diagnosis not present

## 2016-01-17 DIAGNOSIS — K047 Periapical abscess without sinus: Secondary | ICD-10-CM

## 2016-01-17 DIAGNOSIS — K0501 Acute gingivitis, non-plaque induced: Secondary | ICD-10-CM | POA: Diagnosis not present

## 2016-01-17 DIAGNOSIS — K0889 Other specified disorders of teeth and supporting structures: Secondary | ICD-10-CM | POA: Diagnosis present

## 2016-01-17 DIAGNOSIS — F17210 Nicotine dependence, cigarettes, uncomplicated: Secondary | ICD-10-CM | POA: Insufficient documentation

## 2016-01-17 MED ORDER — CLINDAMYCIN HCL 300 MG PO CAPS: 300.0000 mg | ORAL_CAPSULE | Freq: Four times a day (QID) | ORAL | Status: DC

## 2016-01-17 MED ORDER — NABUMETONE 750 MG PO TABS: 750.0000 mg | ORAL_TABLET | Freq: Two times a day (BID) | ORAL | Status: DC

## 2016-01-17 MED ORDER — PENICILLIN V POTASSIUM 500 MG PO TABS: 500.0000 mg | ORAL_TABLET | Freq: Four times a day (QID) | ORAL | Status: DC

## 2016-01-17 NOTE — Discharge Instructions (Signed)
Take the prescription antibiotic as directed until completely gone. Take the pain medicine as needed for pain. Rinse after each meal with warm, salty water. You must follow-up with one of the dental clinics listed below for further treatment.   Dental Abscess A dental abscess is pus in or around a tooth. HOME CARE  Take medicines only as told by your dentist.  If you were prescribed antibiotic medicine, finish all of it even if you start to feel better.  Rinse your mouth (gargle) often with salt water.  Do not drive or use heavy machinery, like a lawn mower, while taking pain medicine.  Do not apply heat to the outside of your mouth.  Keep all follow-up visits as told by your dentist. This is important. GET HELP IF:  Your pain is worse, and medicine does not help. GET HELP RIGHT AWAY IF:  You have a fever or chills.  Your symptoms suddenly get worse.  You have a very bad headache.  You have problems breathing or swallowing.  You have trouble opening your mouth.  You have puffiness (swelling) in your neck or around your eye.   This information is not intended to replace advice given to you by your health care provider. Make sure you discuss any questions you have with your health care provider.   Document Released: 11/22/2014 Document Reviewed: 11/22/2014 Elsevier Interactive Patient Education 2016 Elsevier Inc.  Dental Caries Dental caries is tooth decay. This decay can cause a hole in teeth (cavity) that can get bigger and deeper over time. HOME CARE  Brush and floss your teeth. Do this at least two times a day.  Use a fluoride toothpaste.  Use a mouth rinse if told by your dentist or doctor.  Eat less sugary and starchy foods. Drink less sugary drinks.  Avoid snacking often on sugary and starchy foods. Avoid sipping often on sugary drinks.  Keep regular checkups and cleanings with your dentist.  Use fluoride supplements if told by your dentist or  doctor.  Allow fluoride to be applied to teeth if told by your dentist or doctor.   This information is not intended to replace advice given to you by your health care provider. Make sure you discuss any questions you have with your health care provider.   Document Released: 04/16/2008 Document Revised: 07/29/2014 Document Reviewed: 07/10/2012 Elsevier Interactive Patient Education 2016 [employer].   OPTIONS FOR DENTAL FOLLOW UP CARE  [place] Department of Health and Human Services - Local Safety Net Dental Clinics [email or web address]   [facility] [phone])  [facility] [phone])  [place] [phone] ext [phone])  [facility] [phone])  [facility] [phone]) This clinic caters to the indigent population and is on a lottery system. Location: [employer] [facility], [employer], [address] [place] Clinic Hours: Wednesdays from 6pm - 9pm, patients seen by a lottery system. For dates, call or go to [email or web address] Services: Cleanings, fillings and simple extractions. Payment Options: DENTAL WORK IS FREE OF CHARGE. Bring proof of income or support. Best way to get seen: Arrive at 5:15 pm - this is a lottery, NOT first come/first serve, so arriving earlier will not increase your chances of being seen.     [facility] [phone] Select option 1 for emergencies   Location: [facility], [place], [address], [place] Clinic Hours: No walk-ins accepted - call the day before to schedule an appointment.  Check in times are 9:30 am and 1:30 pm. Services: Simple extractions, temporary fillings, pulpectomy/pulp debridement, uncomplicated abscess drainage. Payment Options: PAYMENT IS DUE AT THE TIME OF SERVICE.  Fee is usually $100-200, additional surgical procedures  (e.g. abscess drainage) may be extra. Cash, checks, Visa/MasterCard accepted.  Can file Medicaid if patient is covered for dental - patient should call case worker to check. No discount for [facility] patients. Best way to get seen: MUST call the day before and get onto the schedule. Can usually be seen the next 1-2 days. No walk-ins accepted.     [facility] [phone]   Location: [facility], [address], [place] Clinic Hours: M, W, Th, F 8am or 1:30pm, Tues 9a or 1:30 - first come/first served. Services: Simple extractions, temporary fillings, uncomplicated abscess drainage.  You do not need to be an [place] resident. Payment Options: PAYMENT IS DUE AT THE TIME OF SERVICE. Dental insurance, otherwise sliding scale - bring proof of income or support. Depending on income and treatment needed, cost is usually $50-200. Best way to get seen: Arrive early as it is first come/first served.     [facility] [phone]   Location: [address] Clinic Hours: Mon-Thu 8a-5p Services: Most basic dental services including extractions and fillings. Payment Options: PAYMENT IS DUE AT THE TIME OF SERVICE. Sliding scale, up to 50% off - bring proof if income or support. Medicaid with dental option accepted. Best way to get seen: Call to schedule an appointment, can usually be seen within 2 weeks OR they will try to see walk-ins - show up at 8a or 2p (you may have to wait).     [facility] [phone] [place] RESIDENTS ONLY   Location: [facility], [address], [place], [place] [address] Clinic Hours: By appointment only. Monday - Thursday 8am-5pm, Friday 8am-12pm Services: Cleanings, fillings, extractions. Payment Options: PAYMENT IS DUE AT THE TIME OF SERVICE. Cash, Visa or MasterCard. Sliding scale - $30 minimum per service. Best way  to get seen: Come in to office, complete packet and make an appointment - need proof of income or support monies for each household member and proof of [place] residence. Usually takes about a month to get in.     [facility] [phone]   Location: [address] [facility] Hours: Walk-in Urgent Care Dental Services are offered Monday-Friday mornings only. The numbers of emergencies accepted daily is limited to the number of providers available. Maximum 15 - Mondays, Wednesdays & Thursdays Maximum 10 - Tuesdays & Fridays Services: You do not need to be a [place] resident to be seen for a dental emergency. Emergencies are defined as pain, swelling, abnormal bleeding, or dental trauma. Walkins will receive x-rays if needed. NOTE: Dental cleaning is not an emergency. Payment Options: PAYMENT IS DUE AT THE TIME OF SERVICE. Minimum co-pay is $40.00 for uninsured patients. Minimum co-pay is $3.00 for Medicaid with dental coverage. Dental Insurance is accepted and must be presented at time of visit. Medicare does not cover dental. Forms of payment: Cash, credit card, checks. Best way to get seen: If not previously registered with the clinic, walk-in dental registration begins at 7:15 am and is on a first come/first serve basis. If previously registered with the clinic, call to make an appointment.     [facility] [phone] [place] RESIDENTS ONLY   Location: [address], [place], [place] Clinic Hours: Mon-Thu 10a-2p Services: Extractions only! Payment Options: FREE (donations accepted) - bring proof of income or support Best way to get seen: Call and schedule an appointment OR come at 8am on the 1st Monday of every month (except for holidays) when it is first come/first served.     [facility] [phone]   [address]: [address], [place] Clinic Hours: Friday mornings Services, Payment  Options, Best way to get seen: Call for info

## 2016-01-17 NOTE — ED Provider Notes (Signed)
[facility] Emergency Department Provider Note ____________________________________________  Time seen: [id number]  I have reviewed the triage vital signs and the nursing notes.  HISTORY  Chief Complaint  Dental Pain  HPI Nicolas Bowers is a 30 y.o. male to the ED for evaluation of right-sided dental pain and local swelling..Denies any interim fevers, chills, sweats. He describes onset of dental pain to the right lower jaw about 2 days prior. He does note a chronically broken tooth to the right lower jaw has been seen previously for dental pain and infection. He has not followed up with the dental clinic citing his six-day-a-week work schedule. Reports pain at 8/10 in triage.  History reviewed. No pertinent past medical history.  There are no active problems to display for this patient.   Past Surgical History  Procedure Laterality Date  . Hernia repair      Current Outpatient Rx  Name  Route  Sig  Dispense  Refill  . bacitracin ointment      Apply to affected on penis twice a day for 7 days.   30 g   0   . clindamycin (CLEOCIN) 300 MG capsule   Oral   Take 1 capsule (300 mg total) by mouth 4 (four) times daily.   40 capsule   0   . famotidine (PEPCID) 20 MG tablet   Oral   Take 1 tablet (20 mg total) by mouth 2 (two) times daily.   30 tablet   0   . magic mouthwash w/lidocaine SOLN   Oral   Take 5 mLs by mouth 4 (four) times daily.   240 mL   0     Dispense in a 1/1/1/1 ratio. Use lidocaine, diphen ...   . naproxen (NAPROSYN) 500 MG tablet   Oral   Take 1 tablet (500 mg total) by mouth 2 (two) times daily.   20 tablet   0   . ondansetron (ZOFRAN) 4 MG tablet   Oral   Take 1 tablet (4 mg total) by mouth daily as needed.   10 tablet   0    Allergies Ultram  No family history on file.  Social History Social History  Substance Use Topics  . Smoking status: Current Every Day Smoker -- 1.00 packs/day for 12 years    Types:  Cigarettes  . Smokeless tobacco: Never Used  . Alcohol Use: No   Review of Systems  Constitutional: Negative for fever. Eyes: Negative for visual changes. ENT: Negative for sore throat. Dental pain as above.  Respiratory: Negative for shortness of breath. Musculoskeletal: Negative for back pain. Neurological: Negative for headaches, focal weakness or numbness. ____________________________________________  PHYSICAL EXAM:  VITAL SIGNS: ED Triage Vitals  Enc Vitals Group     BP 01/17/16 1837 125/64 mmHg     Pulse Rate 01/17/16 1837 111     Resp 01/17/16 1837 18     Temp 01/17/16 1837 98.3 F (36.8 C)     Temp Source 01/17/16 1837 Oral     SpO2 01/17/16 1837 100 %     Weight 01/17/16 1837 135 lb (61.236 kg)     Height --      Head Cir --      Peak Flow --      Pain Score 01/17/16 1838 8     Pain Loc --      Pain Edu? --      Excl. in GC? --    Constitutional: Alert and oriented. Well  appearing and in no distress. Head: Normocephalic and atraumatic.      Eyes: Conjunctivae are normal. PERRL. Normal extraocular movements     Mouth/Throat: Mucous membranes are moist. Uvula is midline and tonsils are flat. General poor dental hygeine with gumline dental tartar and moderate gingivitis. The 1st molar on the lower right jaw reveals a chronic fracture secondary to caries, revealing dentin. Local edema to the lower jaw noted.    Neck: Supple. No thyromegaly. Hematological/Lymphatic/Immunological: No cervical lymphadenopathy. Respiratory: Normal respiratory effort.  Musculoskeletal: Nontender with normal range of motion in all extremities.  Neurologic:  No gross focal neurologic deficits are appreciated. Skin:  Skin is warm, dry and intact. No rash noted. ____________________________________________  PROCEDURES  Clindamycin 300 mg PO Naprosyn 500 mg PO Patient declined a dental block for pain relief. 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 He will be discharged with a prescription for clindamycin as directed. He is again provided with a list of dental clinics.  ____________________________________________  FINAL CLINICAL IMPRESSION(S) / ED DIAGNOSES  Final diagnoses:  Pain due to dental caries  Dental infection      Lissa HoardJenise V Bacon Bayyinah Dukeman, PA-C 01/18/16 0022  Minna AntisKevin Paduchowski, MD 01/19/16 2258

## 2016-01-17 NOTE — ED Notes (Signed)
Pt arrives to ER c/o right sided dental pain. Pt alert and oriented X4, active, cooperative, pt in NAD. RR even and unlabored, color WNL.

## 2016-01-17 NOTE — ED Notes (Signed)
Discharge instructions reviewed with patient. Patient verbalized understanding. Patient ambulated to lobby without difficulty.   

## 2016-03-26 ENCOUNTER — Emergency Department
Admission: EM | Admit: 2016-03-26 | Discharge: 2016-03-26 | Disposition: A | Discharge status: Home / Self Care | Payer: BLUE CROSS/BLUE SHIELD | Attending: Emergency Medicine | Admitting: Emergency Medicine

## 2016-03-26 ENCOUNTER — Encounter: Payer: Self-pay | Admitting: Medical Oncology

## 2016-03-26 DIAGNOSIS — K029 Dental caries, unspecified: Secondary | ICD-10-CM

## 2016-03-26 DIAGNOSIS — K0381 Cracked tooth: Secondary | ICD-10-CM | POA: Insufficient documentation

## 2016-03-26 DIAGNOSIS — F17210 Nicotine dependence, cigarettes, uncomplicated: Secondary | ICD-10-CM | POA: Insufficient documentation

## 2016-03-26 DIAGNOSIS — S025XXB Fracture of tooth (traumatic), initial encounter for open fracture: Secondary | ICD-10-CM

## 2016-03-26 MED ORDER — KETOROLAC TROMETHAMINE 30 MG/ML IJ SOLN
30.0000 mg | Freq: Once | INTRAMUSCULAR | Status: AC
  Administered 2016-03-26: 30 mg via INTRAMUSCULAR
  Filled 2016-03-26: qty 1

## 2016-03-26 MED ORDER — LIDOCAINE VISCOUS 2 % MT SOLN: 10.0000 mL | OROMUCOSAL | 0 refills | Status: DC | PRN

## 2016-03-26 MED ORDER — AMOXICILLIN 500 MG PO TABS: 500.0000 mg | ORAL_TABLET | Freq: Three times a day (TID) | ORAL | 0 refills | Status: DC

## 2016-03-26 NOTE — ED Triage Notes (Signed)
Pt reports left dental pain that began this am

## 2016-03-26 NOTE — ED Provider Notes (Signed)
[facility] Emergency Department Provider Note  ____________________________________________  Time seen: Approximately 8:04 AM  I have reviewed the triage vital signs and the nursing notes.   HISTORY  Chief Complaint Dental Pain    HPI Nicolas Bowers is a 30 y.o. male , NAD, presents to emergency department with one-day history of left upper tooth pain. Patient states he was talking, but it down and broke the left upper posterior molar. States he had no significant pain about the area until around 2 AM today. States he does not have a Education officer, communitydentist but plans to go to [facility] [employer] [facility] for dental care. Does have a history of significant cavities and infections in the past. Has been on clindamycin 2 over the last few months for dental abscess and infection. Denies any injury or trauma to the face or neck. Has not had any fevers, chills, body aches. Has not noted any swelling or redness to the mouth or face.   History reviewed. No pertinent past medical history.  There are no active problems to display for this patient.   Past Surgical History:  Procedure Laterality Date  . HERNIA REPAIR      Prior to Admission medications   Medication Sig Start Date End Date Taking? Authorizing Provider  amoxicillin (AMOXIL) 500 MG tablet Take 1 tablet (500 mg total) by mouth 3 (three) times daily with meals. 03/26/16   Suprena Travaglini L Carnelia Oscar, PA-C  lidocaine (XYLOCAINE) 2 % solution Use as directed 10 mLs in the mouth or throat every 4 (four) hours as needed for mouth pain. 03/26/16   Graiden Henes L Liela Rylee, PA-C    Allergies Ultram [tramadol]  No family history on file.  Social History Social History  Substance Use Topics  . Smoking status: Current Every Day Smoker    Packs/day: 1.00    Years: 12.00    Types: Cigarettes  . Smokeless tobacco: Never Used  . Alcohol use No     Review of Systems  Constitutional: No fever/chills ENT: Positive dental pain, broken tooth.   Cardiovascular: No chest pain. Respiratory:  No shortness of breath. Musculoskeletal: Negative for general myalgias.  Skin: Negative for rash, redness, swelling. Neurological: Negative for headache. 10-point ROS otherwise negative.  ____________________________________________   PHYSICAL EXAM:  VITAL SIGNS: ED Triage Vitals  Enc Vitals Group     BP 03/26/16 0734 131/73     Pulse Rate 03/26/16 0734 84     Resp 03/26/16 0734 16     Temp 03/26/16 0734 97.5 F (36.4 C)     Temp Source 03/26/16 0734 Oral     SpO2 03/26/16 0734 100 %     Weight 03/26/16 0733 140 lb (63.5 kg)     Height 03/26/16 0733 5\' 6"  (1.676 m)     Head Circumference --      Peak Flow --      Pain Score 03/26/16 0733 9     Pain Loc --      Pain Edu? --      Excl. in GC? --      Constitutional: Alert and oriented. Well appearing and in no acute distress. Eyes: Conjunctivae are normal. Head: Atraumatic. ENT:      Nose: No congestion/rhinnorhea.      Mouth/Throat: Dental caries and discolored teeth throughout. Left upper posterior molar fractured with multiple dental caries. No oozing or weeping. No swelling or redness. Tenderness to palpation of the areas noted. Full range of motion of the TMJ without pain.  Mucous membranes are moist. Pharynx without erythema, swelling, oozing. Airways patent. Neck: Supple with full range of motion. Hematological/Lymphatic/Immunilogical: No cervical lymphadenopathy. Cardiovascular: Good peripheral circulation. Respiratory: Normal respiratory effort without tachypnea or retractions. Musculoskeletal: Full range of motion of bilateral upper and lower extremities without pain or difficulty. Neurologic:  Normal speech and language. No gross focal neurologic deficits are appreciated.  Skin:  Skin is warm, dry and intact. No rash noted. Psychiatric: Mood and affect are normal. Speech and behavior are normal. Patient exhibits appropriate insight and  judgement.   ____________________________________________   LABS  None ____________________________________________  EKG  None ____________________________________________  RADIOLOGY  None ____________________________________________    PROCEDURES  Procedure(s) performed: None   Procedures   Medications  ketorolac (TORADOL) 30 MG/ML injection 30 mg (30 mg Intramuscular Given 03/26/16 0833)     ____________________________________________   INITIAL IMPRESSION / ASSESSMENT AND PLAN / ED COURSE  Pertinent labs & imaging results that were available during my care of the patient were reviewed by me and considered in my medical decision making (see chart for details).  Clinical Course    Patient's diagnosis is consistent with Broken tooth and pain due to dental caries. Patient will be discharged home with prescriptions for amoxicillin and lidocaine viscus to take as directed. Patient is advised to go to the [facility] dental clinic in [facility] today for evaluation and further treatment.  Patient is given ED precautions to return to the ED for any worsening or new symptoms.    ____________________________________________  FINAL CLINICAL IMPRESSION(S) / ED DIAGNOSES  Final diagnoses:  Broken tooth, open, initial encounter  Pain due to dental caries      NEW MEDICATIONS STARTED DURING THIS VISIT:  New Prescriptions   AMOXICILLIN (AMOXIL) 500 MG TABLET    Take 1 tablet (500 mg total) by mouth 3 (three) times daily with meals.   LIDOCAINE (XYLOCAINE) 2 % SOLUTION    Use as directed 10 mLs in the mouth or throat every 4 (four) hours as needed for mouth pain.         Hope Pigeon, PA-C 03/26/16 4098    Emily Filbert, MD 03/26/16 [phone]

## 2016-05-13 ENCOUNTER — Emergency Department
Admission: EM | Admit: 2016-05-13 | Discharge: 2016-05-13 | Disposition: A | Discharge status: Home / Self Care | Payer: BLUE CROSS/BLUE SHIELD | Attending: Emergency Medicine | Admitting: Emergency Medicine

## 2016-05-13 DIAGNOSIS — K0889 Other specified disorders of teeth and supporting structures: Secondary | ICD-10-CM

## 2016-05-13 DIAGNOSIS — F17210 Nicotine dependence, cigarettes, uncomplicated: Secondary | ICD-10-CM | POA: Insufficient documentation

## 2016-05-13 DIAGNOSIS — Z79899 Other long term (current) drug therapy: Secondary | ICD-10-CM | POA: Insufficient documentation

## 2016-05-13 DIAGNOSIS — K0381 Cracked tooth: Secondary | ICD-10-CM | POA: Insufficient documentation

## 2016-05-13 MED ORDER — AMOXICILLIN 500 MG PO CAPS: 500.0000 mg | ORAL_CAPSULE | Freq: Three times a day (TID) | ORAL | 0 refills | Status: DC

## 2016-05-13 MED ORDER — IBUPROFEN 800 MG PO TABS: 800.0000 mg | ORAL_TABLET | Freq: Three times a day (TID) | ORAL | 0 refills | Status: DC | PRN

## 2016-05-13 NOTE — ED Notes (Signed)
See triage note  conts to have pain to same tooth  Tooth has been broken for some time and has been seen several times for same tooth

## 2016-05-13 NOTE — ED Triage Notes (Signed)
Pt c/o waking with right lower facial swelling from a tooth that has been broken for a while..Marland Kitchen

## 2016-05-13 NOTE — Discharge Instructions (Signed)
Advised to follow-up list of dental clinics provided: °OPTIONS FOR DENTAL FOLLOW UP CARE ° °[place] [facility] - Local Safety Net Dental Clinics °http://www.ncdhhs.gov/dph/oralhealth/services/safetynetclinics.htm °  °[facility] (336-562-3123) ° °[facility] [place] (919-933-9087) ° °[facility] ([phone] ext 237) ° °[facility] ([phone]) ° °[facility] ([phone]) °This clinic caters to the indigent population and is on a lottery system. °Location: °[facility], [address], [place] °Clinic Hours: °Wednesdays from 6pm - 9pm, patients seen by a lottery system. °For dates, call or go to www.med.[facility].edu/[facility]/patients/Dental-[facility] °Services: °Cleanings, fillings and simple extractions. °Payment Options: °DENTAL WORK IS FREE OF CHARGE. Bring proof of income or support. °Best way to get seen: °Arrive at 5:15 pm - this is a lottery, NOT first come/first serve, so arriving earlier will not increase your chances of being seen. °  °  °[facility] °919-537-3737 °Select option 1 for emergencies °  °Location: °[facility], [address], [place] °Clinic Hours: °No walk-ins accepted - call the day before to schedule an appointment. °Check in times are 9:30 am and 1:30 pm. °Services: °Simple extractions, temporary fillings, pulpectomy/pulp debridement, uncomplicated abscess drainage. °Payment Options: °PAYMENT IS DUE AT THE TIME OF SERVICE.  Fee is usually $100-200, additional surgical procedures (e.g. abscess drainage) may be extra. °Cash, checks, Visa/MasterCard accepted.  Can file Medicaid if patient is covered for dental - patient should call case worker to check. °No discount for [facility] patients. °Best way to get seen: °MUST call the day before and get onto the schedule. Can usually be seen the next 1-2 days. No walk-ins accepted. °  °   °[facility] °[phone] °  °Location: °[facility], [address], [place] °Clinic Hours: °M, W, Th, F 8am or 1:30pm, Tues 9a or 1:30 - first come/first served. °Services: °Simple extractions, temporary fillings, uncomplicated abscess drainage.  You do not need to be an [place] resident. °Payment Options: °PAYMENT IS DUE AT THE TIME OF SERVICE. °Dental insurance, otherwise sliding scale - bring proof of income or support. °Depending on income and treatment needed, cost is usually $50-200. °Best way to get seen: °Arrive early as it is first come/first served. °  °  °[facility] °[phone] °  °Location: °[address] °Clinic Hours: °Mon-Thu 8a-5p °Services: °Most basic dental services including extractions and fillings. °Payment Options: °PAYMENT IS DUE AT THE TIME OF SERVICE. °Sliding scale, up to 50% off - bring proof if income or support. °Medicaid with dental option accepted. °Best way to get seen: °Call to schedule an appointment, can usually be seen within 2 weeks OR they will try to see walk-ins - show up at 8a or 2p (you may have to wait). °  °  °[facility] °919-245-2435 °[place] RESIDENTS ONLY °  °Location: °[facility], [address], [place], [place] [address] °Clinic Hours: By appointment only. °Monday - Thursday 8am-5pm, Friday 8am-12pm °Services: Cleanings, fillings, extractions. °Payment Options: °PAYMENT IS DUE AT THE TIME OF SERVICE. °Cash, Visa or MasterCard. Sliding scale - $30 minimum per service. °Best way to get seen: °Come in to office, complete packet and make an appointment - need proof of income °or support monies for each household member and proof of [place] residence. °Usually takes about a month to get in. °  °  °[facility] °[phone] °  °Location: °[address] [place] °Clinic Hours: Walk-in Urgent Care  Dental Services are offered Monday-Friday mornings only. °The numbers of emergencies accepted daily is limited to the number of °providers available. °Maximum 15 - Mondays, Wednesdays & Thursdays °Maximum 10 - Tuesdays & Fridays °Services: °You do not need to be a [place] resident to be seen for a dental emergency. °Emergencies are defined as pain, swelling, abnormal bleeding, or dental trauma. Walkins will receive x-rays if needed. °NOTE: Dental cleaning is not an emergency. °Payment Options: °PAYMENT IS DUE AT THE TIME OF SERVICE. °Minimum co-pay is $40.00 for uninsured patients. °Minimum co-pay is $3.00 for Medicaid with dental coverage. °Dental Insurance is accepted and must be presented at time of visit. °Medicare does not cover dental. °Forms of payment: Cash, credit card, checks. °Best way to get seen: °If not previously registered with the clinic, walk-in dental registration begins at 7:15 am and is on a first come/first serve basis. °If previously registered with the clinic, call to make an appointment. °  °  °[facility] °[phone] °[place] RESIDENTS ONLY °  °Location: °[address], [place], [place] °Clinic Hours: °Mon-Thu 10a-2p °Services: Extractions only! °Payment Options: °FREE (donations accepted) - bring proof of income or support °Best way to get seen: °Call and schedule an appointment OR come at 8am on the 1st Monday of every month (except for holidays) when it is first come/first served. °  °  °[facility] °[phone] °  °Location: °[address], [place] °Clinic Hours: °Friday mornings °Services, Payment Options, Best way to get seen: °Call for info ° °

## 2016-05-13 NOTE — ED Provider Notes (Signed)
[facility] Emergency Department Provider Note   ____________________________________________   First MD Initiated Contact with Patient 05/13/16 [phone]     (approximate)  I have reviewed the triage vital signs and the nursing notes.   HISTORY  Chief Complaint Dental Pain    HPI Nicolas [facility]ers is a 30 y.o. male patient complaining of right lower jaw edema with 2-3 weeks. Patient has a history of fractured teeth. This is the patient's 6 visits since pain 2017 and patient has not followed up with a dentist. Patient rates his pain as 8/10. Patient state no relief over-the-counter medications. Patient states does not have dental insurance.  History reviewed. No pertinent past medical history.  There are no active problems to display for this patient.   Past Surgical History:  Procedure Laterality Date  . HERNIA REPAIR      Prior to Admission medications   Medication Sig Start Date End Date Taking? Authorizing Provider  amoxicillin (AMOXIL) 500 MG capsule Take 1 capsule (500 mg total) by mouth 3 (three) times daily. 05/13/16   Joni Reiningonald K Smith, PA-C  amoxicillin (AMOXIL) 500 MG tablet Take 1 tablet (500 mg total) by mouth 3 (three) times daily with meals. 03/26/16   Jami L Hagler, PA-C  ibuprofen (ADVIL,MOTRIN) 800 MG tablet Take 1 tablet (800 mg total) by mouth every 8 (eight) hours as needed. 05/13/16   Joni Reiningonald K Smith, PA-C  lidocaine (XYLOCAINE) 2 % solution Use as directed 10 mLs in the mouth or throat every 4 (four) hours as needed for mouth pain. 03/26/16   Jami L Hagler, PA-C    Allergies Ultram [tramadol]  No family history on file.  Social History Social History  Substance Use Topics  . Smoking status: Current Every Day Smoker    Packs/day: 1.00    Years: 12.00    Types: Cigarettes  . Smokeless tobacco: Never Used  . Alcohol use No    Review of Systems Constitutional: No fever/chills Eyes: No visual changes. ENT: No sore throat. Dental  pain secondary to multiple caries or fractures of the upper and lower right molars. Cardiovascular: Denies chest pain. Respiratory: Denies shortness of breath. Gastrointestinal: No abdominal pain.  No nausea, no vomiting.  No diarrhea.  No constipation. Genitourinary: Negative for dysuria. Musculoskeletal: Negative for back pain. Skin: Negative for rash. Neurological: Negative for headaches, focal weakness or numbness.    ____________________________________________   PHYSICAL EXAM:  VITAL SIGNS: ED Triage Vitals  Enc Vitals Group     BP 05/13/16 0845 (!) 145/74     Pulse Rate 05/13/16 0845 64     Resp 05/13/16 0845 17     Temp 05/13/16 0845 97.5 F (36.4 C)     Temp Source 05/13/16 0845 Oral     SpO2 05/13/16 0845 98 %     Weight 05/13/16 0845 140 lb (63.5 kg)     Height 05/13/16 0845 5\' 7"  (1.702 m)     Head Circumference --      Peak Flow --      Pain Score 05/13/16 0846 8     Pain Loc --      Pain Edu? --      Excl. in GC? --     Constitutional: Alert and oriented. Well appearing and in no acute distress. Eyes: Conjunctivae are normal. PERRL. EOMI. Head: Atraumatic. Nose: No congestion/rhinnorhea. Mouth/Throat: Mucous membranes are moist.  Oropharynx non-erythematous. Neck: No stridor.  No cervical spine tenderness to palpation. Hematological/Lymphatic/Immunilogical: No cervical  lymphadenopathy. Cardiovascular: Normal rate, regular rhythm. Grossly normal heart sounds.  Good peripheral circulation. Respiratory: Normal respiratory effort.  No retractions. Lungs CTAB. Gastrointestinal: Soft and nontender. No distention. No abdominal bruits. No CVA tenderness. Musculoskeletal: No lower extremity tenderness nor edema.  No joint effusions. Neurologic:  Normal speech and language. No gross focal neurologic deficits are appreciated. No gait instability. Skin:  Skin is warm, dry and intact. No rash noted. Psychiatric: Mood and affect are normal. Speech and behavior are  normal.  ____________________________________________   LABS (all labs ordered are listed, but only abnormal results are displayed)  Labs Reviewed - No data to display ____________________________________________  EKG   ____________________________________________  RADIOLOGY   ____________________________________________   PROCEDURES  Procedure(s) performed: None  Procedures  Critical Care performed: No  ____________________________________________   INITIAL IMPRESSION / ASSESSMENT AND PLAN / ED COURSE  Pertinent labs & imaging results that were available during my care of the patient were reviewed by me and considered in my medical decision making (see chart for details).  Dental pain secondary to fractured tooth. Patient given discharge care instructions. Patient advised he must see a dentist for definitive care.  Clinical Course     ____________________________________________   FINAL CLINICAL IMPRESSION(S) / ED DIAGNOSES  Final diagnoses:  Pain, dental      NEW MEDICATIONS STARTED DURING THIS VISIT:  Discharge Medication List as of 05/13/2016  8:58 AM    START taking these medications   Details  amoxicillin (AMOXIL) 500 MG capsule Take 1 capsule (500 mg total) by mouth 3 (three) times daily., Starting Mon 05/13/2016, Print    ibuprofen (ADVIL,MOTRIN) 800 MG tablet Take 1 tablet (800 mg total) by mouth every 8 (eight) hours as needed., Starting Mon 05/13/2016, Print         Note:  This document was prepared using Dragon voice recognition software and may include unintentional dictation errors.    Joni Reining, PA-C 05/13/16 1610    Minna Antis, MD 05/13/16 [phone]

## 2016-06-11 ENCOUNTER — Emergency Department
Admission: EM | Admit: 2016-06-11 | Discharge: 2016-06-11 | Disposition: A | Discharge status: Home / Self Care | Payer: BLUE CROSS/BLUE SHIELD | Attending: Emergency Medicine | Admitting: Emergency Medicine

## 2016-06-11 ENCOUNTER — Encounter: Payer: Self-pay | Admitting: *Deleted

## 2016-06-11 DIAGNOSIS — Z79899 Other long term (current) drug therapy: Secondary | ICD-10-CM | POA: Insufficient documentation

## 2016-06-11 DIAGNOSIS — F17210 Nicotine dependence, cigarettes, uncomplicated: Secondary | ICD-10-CM | POA: Insufficient documentation

## 2016-06-11 DIAGNOSIS — B9789 Other viral agents as the cause of diseases classified elsewhere: Secondary | ICD-10-CM

## 2016-06-11 DIAGNOSIS — J329 Chronic sinusitis, unspecified: Secondary | ICD-10-CM | POA: Insufficient documentation

## 2016-06-11 MED ORDER — PREDNISONE 5 MG PO TABS: 30.0000 mg | ORAL_TABLET | Freq: Every day | ORAL | 0 refills | Status: DC

## 2016-06-11 MED ORDER — KETOROLAC TROMETHAMINE 30 MG/ML IJ SOLN
30.0000 mg | Freq: Once | INTRAMUSCULAR | Status: AC
  Administered 2016-06-11: 30 mg via INTRAMUSCULAR
  Filled 2016-06-11: qty 1

## 2016-06-11 NOTE — ED Notes (Signed)
See triage note having sinus pressure for about 1-2 days  No fever

## 2016-06-11 NOTE — ED Provider Notes (Signed)
[facility] Emergency Department Provider Note  ____________________________________________  Time seen: Approximately 6:47 PM  I have reviewed the triage vital signs and the nursing notes.   HISTORY  Chief Complaint Facial Pain    HPI Nicolas Bowers is a 30 y.o. male , NAD, presents to the emergency department with one-day history of sinus pressure and pain. Patient states yesterday he had right-sided sinus headache and sinus pressure began. Feels pressure behind the right eye that is not accompanied by any drainage, redness or changes in vision. States he has had nasal congestion, runny nose, productive cough but no chest pain, wheezing, shortness of breath. Has had no abdominal pain, nausea or vomiting. Has felt feverish but has not checked his temperature. Has been taking over-the-counter Sudafed without relief of symptoms. States he has had sinus infections in the past and this feels similar. No known sick contacts. No body aches.   History reviewed. No pertinent past medical history.  There are no active problems to display for this patient.   Past Surgical History:  Procedure Laterality Date  . HERNIA REPAIR      Prior to Admission medications   Medication Sig Start Date End Date Taking? Authorizing Provider  amoxicillin (AMOXIL) 500 MG capsule Take 1 capsule (500 mg total) by mouth 3 (three) times daily. 05/13/16   Joni Reiningonald K Smith, PA-C  amoxicillin (AMOXIL) 500 MG tablet Take 1 tablet (500 mg total) by mouth 3 (three) times daily with meals. 03/26/16   Katrice Goel L Orrie Schubert, PA-C  ibuprofen (ADVIL,MOTRIN) 800 MG tablet Take 1 tablet (800 mg total) by mouth every 8 (eight) hours as needed. 05/13/16   Joni Reiningonald K Smith, PA-C  lidocaine (XYLOCAINE) 2 % solution Use as directed 10 mLs in the mouth or throat every 4 (four) hours as needed for mouth pain. 03/26/16   Sharonlee Nine L Rilley Poulter, PA-C  predniSONE (DELTASONE) 5 MG tablet Take 6 tablets (30 mg total) by mouth daily with  breakfast. May take for up to 5 days.  Do not take any NSAIDs with this medication. Take with food. 06/11/16   Goro Wenrick L Joelle Roswell, PA-C    Allergies Ultram [tramadol]  No family history on file.  Social History Social History  Substance Use Topics  . Smoking status: Current Every Day Smoker    Packs/day: 1.00    Years: 12.00    Types: Cigarettes  . Smokeless tobacco: Never Used  . Alcohol use No     Review of Systems  Constitutional: Positive subjective fevers but no chills or rigors. Eyes: No visual changes. No discharge ENT: Positive sinus pressure, sinus pain, nasal congestion. No sore throat, ear drainage. Cardiovascular: No chest pain. Respiratory: Positive cough. No shortness of breath. No wheezing.  Gastrointestinal: No abdominal pain.  No nausea, vomiting.  Musculoskeletal: Negative for General myalgias.  Skin: Negative for rash. Neurological: Positive for sinus headaches, focal weakness or numbness. No tingling, lightheadedness, dizziness. 10-point ROS otherwise negative.  ____________________________________________   PHYSICAL EXAM:  VITAL SIGNS: ED Triage Vitals  Enc Vitals Group     BP 06/11/16 1828 (!) 149/83     Pulse Rate 06/11/16 1828 88     Resp 06/11/16 1828 20     Temp 06/11/16 1828 99.3 F (37.4 C)     Temp Source 06/11/16 1828 Oral     SpO2 06/11/16 1828 100 %     Weight 06/11/16 1829 140 lb (63.5 kg)     Height 06/11/16 1829 5\' 7"  (1.702 m)  Head Circumference --      Peak Flow --      Pain Score --      Pain Loc --      Pain Edu? --      Excl. in GC? --      Constitutional: Alert and oriented. Well appearing and in no acute distress. Eyes: Conjunctivae are normal Without icterus, injection or discharge Head: Atraumatic. ENT:  Positive right-sided maxillary sinus tenderness to percussion.      Ears: TMs visualized bilaterally without erythema, bulging, effusion, perforation.      Nose: No congestion/rhinnorhea.      Mouth/Throat:  Mucous membranes are moist. Pharynx without erythema, swelling, exudate. Neck: Supple with full range of motion. Hematological/Lymphatic/Immunilogical: No cervical lymphadenopathy. Cardiovascular: Normal rate, regular rhythm. Normal S1 and S2.  Good peripheral circulation. Respiratory: Normal respiratory effort without tachypnea or retractions. Lungs CTAB with breath sounds noted in all lung fields. No wheeze, rhonchi, rales. Neurologic:  Normal speech and language. No gross focal neurologic deficits are appreciated.  Skin:  Skin is warm, dry and intact. No rash noted. Psychiatric: Mood and affect are normal. Speech and behavior are normal. Patient exhibits appropriate insight and judgement.   ____________________________________________   LABS  None ____________________________________________  EKG  None ____________________________________________  RADIOLOGY  None ____________________________________________    PROCEDURES  Procedure(s) performed: None   Procedures   Medications  ketorolac (TORADOL) 30 MG/ML injection 30 mg (30 mg Intramuscular Given 06/11/16 1857)     ____________________________________________   INITIAL IMPRESSION / ASSESSMENT AND PLAN / ED COURSE  Pertinent labs & imaging results that were available during my care of the patient were reviewed by me and considered in my medical decision making (see chart for details).  Clinical Course     Patient's diagnosis is consistent with Viral sinusitis. Patient was given IM injection of Toradol in the emergency department. Patient will be discharged home with prescriptions for prednisone to take as directed. May continue over-the-counter Tylenol as he for pain. [facility] symptoms persist past this treatment course. Patient is given ED precautions to return to the ED for any worsening or new symptoms.     ____________________________________________  FINAL CLINICAL IMPRESSION(S) / ED DIAGNOSES  Final diagnoses:  Viral sinusitis      NEW MEDICATIONS STARTED DURING THIS VISIT:  New Prescriptions   PREDNISONE (DELTASONE) 5 MG TABLET    Take 6 tablets (30 mg total) by mouth daily with breakfast. May take for up to 5 days.  Do not take any NSAIDs with this medication. Take with food.         Hope PigeonJami L Orlanda Frankum, PA-C 06/11/16 1911    Myrna Blazeravid Matthew Schaevitz, MD 06/11/16 [phone]

## 2016-06-11 NOTE — ED Triage Notes (Signed)
Pt complains of sinus pressure with congestion for 1 day

## 2016-11-03 ENCOUNTER — Emergency Department: Payer: No Typology Code available for payment source

## 2016-11-03 ENCOUNTER — Encounter: Payer: Self-pay | Admitting: Emergency Medicine

## 2016-11-03 ENCOUNTER — Emergency Department
Admission: EM | Admit: 2016-11-03 | Discharge: 2016-11-03 | Disposition: A | Discharge status: Home / Self Care | Payer: No Typology Code available for payment source | Attending: Emergency Medicine | Admitting: Emergency Medicine

## 2016-11-03 DIAGNOSIS — S72112A Displaced fracture of greater trochanter of left femur, initial encounter for closed fracture: Secondary | ICD-10-CM | POA: Insufficient documentation

## 2016-11-03 DIAGNOSIS — F17210 Nicotine dependence, cigarettes, uncomplicated: Secondary | ICD-10-CM | POA: Insufficient documentation

## 2016-11-03 DIAGNOSIS — Y9389 Activity, other specified: Secondary | ICD-10-CM | POA: Diagnosis not present

## 2016-11-03 DIAGNOSIS — Y92410 Unspecified street and highway as the place of occurrence of the external cause: Secondary | ICD-10-CM | POA: Diagnosis not present

## 2016-11-03 DIAGNOSIS — S79912A Unspecified injury of left hip, initial encounter: Secondary | ICD-10-CM | POA: Diagnosis present

## 2016-11-03 DIAGNOSIS — S72002A Fracture of unspecified part of neck of left femur, initial encounter for closed fracture: Secondary | ICD-10-CM

## 2016-11-03 DIAGNOSIS — Y999 Unspecified external cause status: Secondary | ICD-10-CM | POA: Diagnosis not present

## 2016-11-03 MED ORDER — FENTANYL CITRATE (PF) 100 MCG/2ML IJ SOLN
100.0000 ug | Freq: Once | INTRAMUSCULAR | Status: AC
  Administered 2016-11-03: 100 ug via INTRAVENOUS
  Filled 2016-11-03: qty 2

## 2016-11-03 MED ORDER — FENTANYL CITRATE (PF) 100 MCG/2ML IJ SOLN
50.0000 ug | Freq: Once | INTRAMUSCULAR | Status: AC
  Administered 2016-11-03: 50 ug via INTRAVENOUS
  Filled 2016-11-03: qty 2

## 2016-11-03 MED ORDER — OXYCODONE-ACETAMINOPHEN 5-325 MG PO TABS: 1.0000 | ORAL_TABLET | ORAL | 0 refills | Status: DC | PRN

## 2016-11-03 MED ORDER — FENTANYL CITRATE (PF) 100 MCG/2ML IJ SOLN
100.0000 ug | Freq: Once | INTRAMUSCULAR | Status: AC
Start: 2016-11-03 — End: 2016-11-03
  Administered 2016-11-03: 100 ug via INTRAVENOUS

## 2016-11-03 MED ORDER — OXYCODONE-ACETAMINOPHEN 5-325 MG PO TABS
2.0000 | ORAL_TABLET | Freq: Once | ORAL | Status: AC
  Administered 2016-11-03: 2 via ORAL
  Filled 2016-11-03: qty 2

## 2016-11-03 MED ORDER — FENTANYL CITRATE (PF) 100 MCG/2ML IJ SOLN
INTRAMUSCULAR | Status: AC
  Filled 2016-11-03: qty 2

## 2016-11-03 MED ORDER — FENTANYL CITRATE (PF) 100 MCG/2ML IJ SOLN: 50.0000 ug | Freq: Once | INTRAMUSCULAR | Status: DC
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
## 2016-11-03 NOTE — ED Notes (Signed)
Pt awake requesting additional pain medication. md notified, order for additional fentanyl received.

## 2016-11-03 NOTE — ED Notes (Signed)
Per EMS,. Pt given 100 mcg of fentanyl PTA, Dr. Derrill Kay aware.

## 2016-11-03 NOTE — ED Provider Notes (Addendum)
-----------------------------------------   9:42 PM on 11/03/2016 -----------------------------------------  CT is resulted showing a comminuted fracture of the greater trochanter, but the rest of the femur itself appears intact. We will discharge the patient with crutches, nonweightbearing status, Percocet for pain control and he'll follow-up with Dr. Hyacinth Meeker by calling tomorrow for an appointment.  Discussed CT findings with Dr. Hyacinth Meeker is agreeable with plan to discharge and follow-up in the office.   Minna Antis, MD 11/03/16 0981    Minna Antis, MD 11/03/16 2152
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
## 2016-11-03 NOTE — Discharge Instructions (Addendum)
Please seek medical attention for any high fevers, chest pain, shortness of breath, change in behavior, persistent vomiting, bloody stool or any other new or concerning symptoms.  Call the number provided for orthopedics were morning to arrange a follow-up appointment this week.

## 2016-11-03 NOTE — ED Notes (Signed)
Patient transported to CT 

## 2016-11-10 ENCOUNTER — Emergency Department
Admission: EM | Admit: 2016-11-10 | Discharge: 2016-11-10 | Disposition: A | Discharge status: Home / Self Care | Payer: No Typology Code available for payment source | Attending: Emergency Medicine | Admitting: Emergency Medicine

## 2016-11-10 ENCOUNTER — Emergency Department: Payer: No Typology Code available for payment source

## 2016-11-10 DIAGNOSIS — Y92410 Unspecified street and highway as the place of occurrence of the external cause: Secondary | ICD-10-CM | POA: Diagnosis not present

## 2016-11-10 DIAGNOSIS — Z79899 Other long term (current) drug therapy: Secondary | ICD-10-CM | POA: Diagnosis not present

## 2016-11-10 DIAGNOSIS — S72112A Displaced fracture of greater trochanter of left femur, initial encounter for closed fracture: Secondary | ICD-10-CM | POA: Diagnosis not present

## 2016-11-10 DIAGNOSIS — Y939 Activity, unspecified: Secondary | ICD-10-CM | POA: Diagnosis not present

## 2016-11-10 DIAGNOSIS — M25552 Pain in left hip: Secondary | ICD-10-CM

## 2016-11-10 DIAGNOSIS — F17210 Nicotine dependence, cigarettes, uncomplicated: Secondary | ICD-10-CM | POA: Diagnosis not present

## 2016-11-10 DIAGNOSIS — S79912A Unspecified injury of left hip, initial encounter: Secondary | ICD-10-CM | POA: Diagnosis present

## 2016-11-10 DIAGNOSIS — Y999 Unspecified external cause status: Secondary | ICD-10-CM | POA: Diagnosis not present

## 2016-11-10 DIAGNOSIS — Z791 Long term (current) use of non-steroidal anti-inflammatories (NSAID): Secondary | ICD-10-CM | POA: Insufficient documentation

## 2016-11-10 MED ORDER — OXYCODONE-ACETAMINOPHEN 5-325 MG PO TABS: 2.0000 | ORAL_TABLET | Freq: Four times a day (QID) | ORAL | 0 refills | Status: DC | PRN

## 2016-11-10 MED ORDER — OXYCODONE-ACETAMINOPHEN 5-325 MG PO TABS
2.0000 | ORAL_TABLET | Freq: Once | ORAL | Status: AC
  Administered 2016-11-10: 2 via ORAL
  Filled 2016-11-10: qty 2
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
## 2016-11-10 NOTE — ED Notes (Signed)
Per Flex provider, patient needs to be seen by MD on major or in CPOD

## 2016-11-10 NOTE — ED Notes (Signed)
Unable to obtain signature due to signature pad not functional.
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
## 2017-04-16 ENCOUNTER — Emergency Department
Admission: EM | Admit: 2017-04-16 | Discharge: 2017-04-16 | Disposition: A | Discharge status: Home / Self Care | Payer: Self-pay | Attending: Emergency Medicine | Admitting: Emergency Medicine

## 2017-04-16 ENCOUNTER — Encounter: Payer: Self-pay | Admitting: Medical Oncology

## 2017-04-16 DIAGNOSIS — K029 Dental caries, unspecified: Secondary | ICD-10-CM | POA: Insufficient documentation

## 2017-04-16 DIAGNOSIS — K047 Periapical abscess without sinus: Secondary | ICD-10-CM | POA: Insufficient documentation

## 2017-04-16 DIAGNOSIS — F17210 Nicotine dependence, cigarettes, uncomplicated: Secondary | ICD-10-CM | POA: Insufficient documentation

## 2017-04-16 MED ORDER — OXYCODONE-ACETAMINOPHEN 5-325 MG PO TABS: 1.0000 | ORAL_TABLET | Freq: Four times a day (QID) | ORAL | 0 refills | Status: DC | PRN

## 2017-04-16 MED ORDER — IBUPROFEN 600 MG PO TABS: 600.0000 mg | ORAL_TABLET | Freq: Four times a day (QID) | ORAL | 0 refills | Status: DC | PRN

## 2017-04-16 MED ORDER — AMOXICILLIN 500 MG PO CAPS: 500.0000 mg | ORAL_CAPSULE | Freq: Three times a day (TID) | ORAL | 0 refills | Status: DC

## 2017-04-16 NOTE — ED Provider Notes (Signed)
[facility] Emergency Department Provider Note   ____________________________________________   First MD Initiated Contact with Patient 04/16/17 1014     (approximate)  I have reviewed the triage vital signs and the nursing notes.   HISTORY  Chief Complaint Dental Pain    HPI [name] is a 31 y.o. male patient complaining of dental painthat started yesterday. Patient has a history devitalize and multiple dental caries. Patient rates his pain as a 6/10. Patient described his pain as "achy". No palliative measures for complaint. Patient state he recently obtained dental insurance.   History reviewed. No pertinent past medical history.  There are no active problems to display for this patient.   Past Surgical History:  Procedure Laterality Date  . HERNIA REPAIR      Prior to Admission medications   Medication Sig Start Date End Date Taking? Authorizing Provider  amoxicillin (AMOXIL) 500 MG capsule Take 1 capsule (500 mg total) by mouth 3 (three) times daily. Patient not taking: Reported on 11/03/2016 05/13/16   Joni Reining, PA-C  amoxicillin (AMOXIL) 500 MG capsule Take 1 capsule (500 mg total) by mouth 3 (three) times daily. 04/16/17   Joni Reining, PA-C  amoxicillin (AMOXIL) 500 MG tablet Take 1 tablet (500 mg total) by mouth 3 (three) times daily with meals. Patient not taking: Reported on 11/03/2016 03/26/16   Hagler, Jami L, PA-C  ibuprofen (ADVIL,MOTRIN) 600 MG tablet Take 1 tablet (600 mg total) by mouth every 6 (six) hours as needed. 04/16/17   Joni Reining, PA-C  ibuprofen (ADVIL,MOTRIN) 800 MG tablet Take 1 tablet (800 mg total) by mouth every 8 (eight) hours as needed. Patient not taking: Reported on 11/03/2016 05/13/16   Joni Reining, PA-C  lidocaine (XYLOCAINE) 2 % solution Use as directed 10 mLs in the mouth or throat every 4 (four) hours as needed for mouth pain. Patient not taking: Reported on 11/03/2016 03/26/16   Hagler,  Jami L, PA-C  oxyCODONE-acetaminophen (PERCOCET) 5-325 MG tablet Take 2 tablets by mouth every 6 (six) hours as needed for moderate pain or severe pain. 11/10/16   Emily Filbert, MD  oxyCODONE-acetaminophen (ROXICET) 5-325 MG tablet Take 1 tablet by mouth every 4 (four) hours as needed for severe pain. 11/03/16   Phineas Semen, MD  oxyCODONE-acetaminophen (ROXICET) 5-325 MG tablet Take 1 tablet by mouth every 6 (six) hours as needed for moderate pain. 04/16/17   Joni Reining, PA-C  predniSONE (DELTASONE) 5 MG tablet Take 6 tablets (30 mg total) by mouth daily with breakfast. May take for up to 5 days.  Do not take any NSAIDs with this medication. Take with food. Patient not taking: Reported on 11/03/2016 06/11/16   Hagler, Jami L, PA-C    Allergies Ultram [tramadol]  No family history on file.  Social History Social History  Substance Use Topics  . Smoking status: Current Every Day Smoker    Packs/day: 1.00    Years: 12.00    Types: Cigarettes  . Smokeless tobacco: Never Used  . Alcohol use No    Review of Systems Constitutional: No fever/chills Eyes: No visual changes. ENT: No sore throat. Dental pain Cardiovascular: Denies chest pain. Respiratory: Denies shortness of breath. Gastrointestinal: No abdominal pain.  No nausea, no vomiting.  No diarrhea.  No constipation. Genitourinary: Negative for dysuria. Musculoskeletal: Negative for back pain. Skin: Negative for rash. Neurological: Negative for headaches, focal weakness or numbness. Allergic/Immunilogical: Tramadol ____________________________________________   PHYSICAL EXAM:  VITAL SIGNS: ED Triage Vitals  Enc Vitals Group     BP 04/16/17 0925 137/71     Pulse Rate 04/16/17 0924 71     Resp 04/16/17 0924 18     Temp 04/16/17 0924 98.2 F (36.8 C)     Temp Source 04/16/17 0924 Oral     SpO2 04/16/17 0924 97 %     Weight 04/16/17 0924 130 lb (59 kg)     Height 04/16/17 0924  (1.676 m)     Head  Circumference --      Peak Flow --      Pain Score 04/16/17 0924 6     Pain Loc --      Pain Edu? --      Excl. in GC? --     Constitutional: Alert and oriented. Well appearing and in no acute distress. Mouth/Throat: Mucous membranes are moist.  Oropharynx non-erythematous. Multiple caries and fractured teeth. Neck: No stridor.  No cervical spine tenderness to palpation. Cardiovascular: Normal rate, regular rhythm. Grossly normal heart sounds.  Good peripheral circulation. Respiratory: Normal respiratory effort.  No retractions. Lungs CTAB. Neurologic:  Normal speech and language. No gross focal neurologic deficits are appreciated. No gait instability.  ____________________________________________   LABS (all labs ordered are listed, but only abnormal results are displayed)  Labs Reviewed - No data to display ____________________________________________  EKG   ____________________________________________  RADIOLOGY  No results found.  ____________________________________________   PROCEDURES  Procedure(s) performed: None  Procedures  Critical Care performed: No  ____________________________________________   INITIAL IMPRESSION / ASSESSMENT AND PLAN / ED COURSE  Pertinent labs & imaging results that were available during my care of the patient were reviewed by me and considered in my medical decision making (see chart for details).  Dental pain secondary to multiple caries and fractured teeth. Patient given discharge Instructions. Patient advised follow-up with [facility]. Patient advised take medication as directed.      ____________________________________________   FINAL CLINICAL IMPRESSION(S) / ED DIAGNOSES  Final diagnoses:  Dental abscess  Infected dental caries      NEW MEDICATIONS STARTED DURING THIS VISIT:  New Prescriptions   AMOXICILLIN (AMOXIL) 500 MG CAPSULE    Take 1 capsule (500 mg total) by mouth 3 (three) times daily.    IBUPROFEN (ADVIL,MOTRIN) 600 MG TABLET    Take 1 tablet (600 mg total) by mouth every 6 (six) hours as needed.   OXYCODONE-ACETAMINOPHEN (ROXICET) 5-325 MG TABLET    Take 1 tablet by mouth every 6 (six) hours as needed for moderate pain.     Note:  This document was prepared using Dragon voice recognition software and may include unintentional dictation errors.    Joni Reining, PA-C 04/16/17 1048    Jeanmarie Plant, MD 04/16/17 1425

## 2017-04-16 NOTE — ED Triage Notes (Signed)
Pt reports left upper dental pain that began yesterday.

## 2017-09-16 ENCOUNTER — Other Ambulatory Visit: Payer: Self-pay

## 2017-09-16 ENCOUNTER — Emergency Department: Payer: Self-pay

## 2017-09-16 ENCOUNTER — Emergency Department
Admission: EM | Admit: 2017-09-16 | Discharge: 2017-09-16 | Disposition: A | Discharge status: Home / Self Care | Payer: Self-pay | Attending: Emergency Medicine | Admitting: Emergency Medicine

## 2017-09-16 DIAGNOSIS — F17210 Nicotine dependence, cigarettes, uncomplicated: Secondary | ICD-10-CM | POA: Insufficient documentation

## 2017-09-16 DIAGNOSIS — Z79899 Other long term (current) drug therapy: Secondary | ICD-10-CM | POA: Insufficient documentation

## 2017-09-16 DIAGNOSIS — M778 Other enthesopathies, not elsewhere classified: Secondary | ICD-10-CM

## 2017-09-16 DIAGNOSIS — M7581 Other shoulder lesions, right shoulder: Secondary | ICD-10-CM | POA: Insufficient documentation

## 2017-09-16 MED ORDER — MELOXICAM 15 MG PO TABS: 15.0000 mg | ORAL_TABLET | Freq: Every day | ORAL | 0 refills | Status: DC

## 2017-09-16 MED ORDER — HYDROCODONE-ACETAMINOPHEN 5-325 MG PO TABS
1.0000 | ORAL_TABLET | Freq: Once | ORAL | Status: AC
  Administered 2017-09-16: 1 via ORAL
  Filled 2017-09-16: qty 1

## 2017-09-16 NOTE — ED Provider Notes (Signed)
[facility] Emergency Department Provider Note  ____________________________________________  Time seen: Approximately 8:01 PM  I have reviewed the triage vital signs and the nursing notes.   HISTORY  Chief Complaint Shoulder Pain     HPI Nicolas Bowers is a 32 y.o. male presents the emergency department for complaint of 2 days of shoulder pain.  Patient reports that he does a lot of heavy lifting and placing heavy items over his shoulder.  Patient denies any specific traumatic event causing pain.  Patient reports that he woke up yesterday morning with "soreness" and this morning the pain has drastically increased.  Patient does have radiation with some numbness and tingling in all 5 digits of the right hand.  Patient denies any headache, neck pain, chest pain, shortness of breath, abdominal pain, nausea or vomiting.  No history of same.  Patient has limited range of motion to the right shoulder due to pain.  No other complaints.  Patient took Motrin yesterday and both Tylenol and Motrin today with limited relief.  No past medical history on file.  There are no active problems to display for this patient.   Past Surgical History:  Procedure Laterality Date  . HERNIA REPAIR      Prior to Admission medications   Medication Sig Start Date End Date Taking? Authorizing Provider  amoxicillin (AMOXIL) 500 MG capsule Take 1 capsule (500 mg total) by mouth 3 (three) times daily. Patient not taking: Reported on 11/03/2016 05/13/16   Joni ReiningSmith, Ronald K, PA-C  amoxicillin (AMOXIL) 500 MG capsule Take 1 capsule (500 mg total) by mouth 3 (three) times daily. 04/16/17   Joni ReiningSmith, Ronald K, PA-C  amoxicillin (AMOXIL) 500 MG tablet Take 1 tablet (500 mg total) by mouth 3 (three) times daily with meals. Patient not taking: Reported on 11/03/2016 03/26/16   Hagler, Jami L, PA-C  ibuprofen (ADVIL,MOTRIN) 600 MG tablet Take 1 tablet (600 mg total) by mouth every 6 (six) hours as needed.  04/16/17   Joni ReiningSmith, Ronald K, PA-C  ibuprofen (ADVIL,MOTRIN) 800 MG tablet Take 1 tablet (800 mg total) by mouth every 8 (eight) hours as needed. Patient not taking: Reported on 11/03/2016 05/13/16   Joni ReiningSmith, Ronald K, PA-C  lidocaine (XYLOCAINE) 2 % solution Use as directed 10 mLs in the mouth or throat every 4 (four) hours as needed for mouth pain. Patient not taking: Reported on 11/03/2016 03/26/16   Hagler, Jami L, PA-C  meloxicam (MOBIC) 15 MG tablet Take 1 tablet (15 mg total) by mouth daily. 09/16/17   Cuthriell, Delorise RoyalsJonathan D, PA-C  oxyCODONE-acetaminophen (PERCOCET) 5-325 MG tablet Take 2 tablets by mouth every 6 (six) hours as needed for moderate pain or severe pain. 11/10/16   Emily FilbertWilliams, Jonathan E, MD  oxyCODONE-acetaminophen (ROXICET) 5-325 MG tablet Take 1 tablet by mouth every 4 (four) hours as needed for severe pain. 11/03/16   Phineas SemenGoodman, Graydon, MD  oxyCODONE-acetaminophen (ROXICET) 5-325 MG tablet Take 1 tablet by mouth every 6 (six) hours as needed for moderate pain. 04/16/17   Joni ReiningSmith, Ronald K, PA-C  predniSONE (DELTASONE) 5 MG tablet Take 6 tablets (30 mg total) by mouth daily with breakfast. May take for up to 5 days.  Do not take any NSAIDs with this medication. Take with food. Patient not taking: Reported on 11/03/2016 06/11/16   Hagler, Jami L, PA-C    Allergies Ultram [tramadol]  No family history on file.  Social History Social History   Tobacco Use  . Smoking status: Current Every Day  Smoker    Packs/day: 1.00    Years: 12.00    Pack years: 12.00    Types: Cigarettes  . Smokeless tobacco: Never Used  Substance Use Topics  . Alcohol use: No  . Drug use: No     Review of Systems  Constitutional: No fever/chills Eyes: No visual changes. Cardiovascular: no chest pain. Respiratory: no cough. No SOB. Musculoskeletal: Positive for right shoulder pain Skin: Negative for rash, abrasions, lacerations, ecchymosis. Neurological: Negative for headaches, focal weakness or  numbness. 10-point ROS otherwise negative.  ____________________________________________   PHYSICAL EXAM:  VITAL SIGNS: ED Triage Vitals  Enc Vitals Group     BP 09/16/17 1858 131/80     Pulse Rate 09/16/17 1858 86     Resp 09/16/17 1858 18     Temp 09/16/17 1858 98.2 F (36.8 C)     Temp Source 09/16/17 1858 Oral     SpO2 09/16/17 1858 99 %     Weight 09/16/17 1858 140 lb (63.5 kg)     Height 09/16/17 1858 6\' 6"  (1.981 m)     Head Circumference --      Peak Flow --      Pain Score 09/16/17 1910 8     Pain Loc --      Pain Edu? --      Excl. in GC? --      Constitutional: Alert and oriented. Well appearing and in no acute distress. Eyes: Conjunctivae are normal. PERRL. EOMI. Head: Atraumatic. Neck: No stridor.  No cervical spine tenderness to palpation.  Cardiovascular: Normal rate, regular rhythm. Normal S1 and S2.  Good peripheral circulation. Respiratory: Normal respiratory effort without tachypnea or retractions. Lungs CTAB. Good air entry to the bases with no decreased or absent breath sounds. Musculoskeletal: Full range of motion to all extremities. No gross deformities appreciated.  No gross deformity, edema, ecchymosis noted to the right shoulder.  Limited range of motion due to pain.  Patient is nontender to palpation along the posterior scapular region.  He is tender to palpation over the musculature of the superior shoulder and along the clavicle and over the acromioclavicular joint.  Mild tenderness to palpation over the proximal biceps.  No palpable abnormality.  Examination of the elbow and wrist is unremarkable.  Radial pulse and sensation intact distally. Neurologic:  Normal speech and language. No gross focal neurologic deficits are appreciated.  Skin:  Skin is warm, dry and intact. No rash noted. Psychiatric: Mood and affect are normal. Speech and behavior are normal. Patient exhibits appropriate insight and  judgement.   ____________________________________________   LABS (all labs ordered are listed, but only abnormal results are displayed)  Labs Reviewed - No data to display ____________________________________________  EKG   ____________________________________________  RADIOLOGY   Dg Shoulder Right  Result Date: 09/16/2017 CLINICAL DATA:  Acute on chronic shoulder pain EXAM: RIGHT SHOULDER - 2+ VIEW COMPARISON:  None. FINDINGS: There is no evidence of fracture or dislocation. There is no evidence of arthropathy or other focal bone abnormality. Soft tissues are unremarkable. IMPRESSION: Negative. Electronically Signed   By: Jasmine Pang M.D.   On: 09/16/2017 20:22    ____________________________________________    PROCEDURES  Procedure(s) performed:    Procedures    Medications  HYDROcodone-acetaminophen (NORCO/VICODIN) 5-325 MG per tablet 1 tablet (1 tablet Oral Given 09/16/17 2032)     ____________________________________________   INITIAL IMPRESSION / ASSESSMENT AND PLAN / ED COURSE  Pertinent labs & imaging results that were available during my  care of the patient were reviewed by me and considered in my medical decision making (see chart for details).  Review of the [place] CSRS was performed in accordance of the [facility] prior to dispensing any controlled drugs.     Patient's diagnosis is consistent with tendinitis of the right shoulder.  Patient reports that he does heavy lifting and carrying items over his right shoulder.  No precipitating injury.  Differential included acromioclavicular joint separation, impingement syndrome, brachial plexus injury, cervical radiculopathy, rotator cuff tear, biceps tear, tendinitis.  he is given sling for comfort. Patient will be discharged home with prescriptions for meloxicam. Patient is to follow up with orthopedics as needed or otherwise directed. Patient is given ED precautions to return to the ED for any worsening or new  symptoms.     ____________________________________________  FINAL CLINICAL IMPRESSION(S) / ED DIAGNOSES  Final diagnoses:  Tendinitis of right shoulder      NEW MEDICATIONS STARTED DURING THIS VISIT:  ED Discharge Orders        Ordered    meloxicam (MOBIC) 15 MG tablet  Daily     09/16/17 2118          This chart was dictated using voice recognition software/Dragon. Despite best efforts to proofread, errors can occur which can change the meaning. Any change was purely unintentional.    Racheal Patches, PA-C 09/16/17 2122    Nita Sickle, MD 09/16/17 [phone]

## 2017-09-16 NOTE — ED Triage Notes (Signed)
Pt in with co right shoulder pain, states has had it for years. States pain worsened this am without injury, states fingertips to right hand tingle at times.

## 2017-09-16 NOTE — ED Notes (Signed)
Pt to the ER for pain in the right shoulder . Pt reports shooting pain from the shoulder down the lateral side of the arm to the fingers. Numbness and tingling to the extremities. Pt has a hx of shoulder injury. Pt denies injury but possible injured with work. 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 Pt says he tried to work today but had to leave. Sitting still shows improvement with pain. Grip decreased on the right hand. Pain increased. No prior hx of same. Pt woke up with the pain.
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
## 2018-01-20 ENCOUNTER — Other Ambulatory Visit: Payer: Self-pay

## 2018-01-20 ENCOUNTER — Emergency Department
Admission: EM | Admit: 2018-01-20 | Discharge: 2018-01-20 | Disposition: A | Discharge status: Home / Self Care | Payer: Self-pay | Attending: Emergency Medicine | Admitting: Emergency Medicine

## 2018-01-20 ENCOUNTER — Encounter: Payer: Self-pay | Admitting: Emergency Medicine

## 2018-01-20 DIAGNOSIS — F17210 Nicotine dependence, cigarettes, uncomplicated: Secondary | ICD-10-CM | POA: Insufficient documentation

## 2018-01-20 DIAGNOSIS — K047 Periapical abscess without sinus: Secondary | ICD-10-CM | POA: Insufficient documentation

## 2018-01-20 DIAGNOSIS — Z79899 Other long term (current) drug therapy: Secondary | ICD-10-CM | POA: Insufficient documentation

## 2018-01-20 DIAGNOSIS — K029 Dental caries, unspecified: Secondary | ICD-10-CM | POA: Insufficient documentation

## 2018-01-20 MED ORDER — AMOXICILLIN-POT CLAVULANATE 875-125 MG PO TABS
1.0000 | ORAL_TABLET | Freq: Once | ORAL | Status: AC
  Administered 2018-01-20: 1 via ORAL

## 2018-01-20 MED ORDER — LIDOCAINE VISCOUS HCL 2 % MT SOLN
15.0000 mL | Freq: Once | OROMUCOSAL | Status: AC
  Administered 2018-01-20: 15 mL via OROMUCOSAL
  Filled 2018-01-20: qty 15

## 2018-01-20 MED ORDER — OXYCODONE-ACETAMINOPHEN 5-325 MG PO TABS
1.0000 | ORAL_TABLET | Freq: Once | ORAL | Status: AC
  Administered 2018-01-20: 1 via ORAL

## 2018-01-20 MED ORDER — OXYCODONE-ACETAMINOPHEN 5-325 MG PO TABS
ORAL_TABLET | ORAL | Status: AC
  Filled 2018-01-20: qty 1

## 2018-01-20 MED ORDER — AMOXICILLIN-POT CLAVULANATE 875-125 MG PO TABS
ORAL_TABLET | ORAL | Status: AC
  Filled 2018-01-20: qty 1

## 2018-01-20 MED ORDER — KETOROLAC TROMETHAMINE 10 MG PO TABS: 10.0000 mg | ORAL_TABLET | Freq: Four times a day (QID) | ORAL | 0 refills | Status: DC | PRN

## 2018-01-20 MED ORDER — KETOROLAC TROMETHAMINE 10 MG PO TABS: 10.0000 mg | ORAL_TABLET | Freq: Once | ORAL | Status: DC

## 2018-01-20 MED ORDER — AMOXICILLIN-POT CLAVULANATE 875-125 MG PO TABS: 1.0000 | ORAL_TABLET | Freq: Two times a day (BID) | ORAL | 0 refills | Status: AC

## 2018-01-20 NOTE — ED Notes (Signed)
ED Provider at bedside. 

## 2018-01-20 NOTE — ED Provider Notes (Signed)
[facility] Emergency Department Provider Note _______   First MD Initiated Contact with Patient 01/20/18 [phone]     (approximate)  I have reviewed the triage vital signs and the nursing notes.   HISTORY  Chief Complaint Dental Pain    HPI Nicolas Bowers is a 32 y.o. male presents to the emergency department 2-day history of left-sided jaw pain and swelling.  Patient states that he has dental caries on the upper and lower molar with broken teeth in both places.  Patient states his current pain score is 10 out of 10.  Patient denies any fever no difficulty breathing or swallowing   Past medical history Noncontributory There are no active problems to display for this patient.   Past Surgical History:  Procedure Laterality Date  . HERNIA REPAIR      Prior to Admission medications   Medication Sig Start Date End Date Taking? Authorizing Provider  amoxicillin (AMOXIL) 500 MG capsule Take 1 capsule (500 mg total) by mouth 3 (three) times daily. Patient not taking: Reported on 11/03/2016 05/13/16   Joni Reining, PA-C  amoxicillin (AMOXIL) 500 MG capsule Take 1 capsule (500 mg total) by mouth 3 (three) times daily. 04/16/17   Joni Reining, PA-C  amoxicillin (AMOXIL) 500 MG tablet Take 1 tablet (500 mg total) by mouth 3 (three) times daily with meals. Patient not taking: Reported on 11/03/2016 03/26/16   Hagler, Jami L, PA-C  ibuprofen (ADVIL,MOTRIN) 600 MG tablet Take 1 tablet (600 mg total) by mouth every 6 (six) hours as needed. 04/16/17   Joni Reining, PA-C  ibuprofen (ADVIL,MOTRIN) 800 MG tablet Take 1 tablet (800 mg total) by mouth every 8 (eight) hours as needed. Patient not taking: Reported on 11/03/2016 05/13/16   Joni Reining, PA-C  lidocaine (XYLOCAINE) 2 % solution Use as directed 10 mLs in the mouth or throat every 4 (four) hours as needed for mouth pain. Patient not taking: Reported on 11/03/2016 03/26/16   Hagler, Jami L, PA-C  meloxicam  (MOBIC) 15 MG tablet Take 1 tablet (15 mg total) by mouth daily. 09/16/17   Cuthriell, Delorise Royals, PA-C  oxyCODONE-acetaminophen (PERCOCET) 5-325 MG tablet Take 2 tablets by mouth every 6 (six) hours as needed for moderate pain or severe pain. 11/10/16   Emily Filbert, MD  oxyCODONE-acetaminophen (ROXICET) 5-325 MG tablet Take 1 tablet by mouth every 4 (four) hours as needed for severe pain. 11/03/16   Phineas Semen, MD  oxyCODONE-acetaminophen (ROXICET) 5-325 MG tablet Take 1 tablet by mouth every 6 (six) hours as needed for moderate pain. 04/16/17   Joni Reining, PA-C  predniSONE (DELTASONE) 5 MG tablet Take 6 tablets (30 mg total) by mouth daily with breakfast. May take for up to 5 days.  Do not take any NSAIDs with this medication. Take with food. Patient not taking: Reported on 11/03/2016 06/11/16   Hagler, Jami L, PA-C    Allergies Ultram [tramadol]  History reviewed. No pertinent family history.  Social History Social History   Tobacco Use  . Smoking status: Current Every Day Smoker    Packs/day: 1.00    Years: 12.00    Pack years: 12.00    Types: Cigarettes  . Smokeless tobacco: Never Used  Substance Use Topics  . Alcohol use: No  . Drug use: No    Review of Systems Constitutional: No fever/chills Eyes: No visual changes. ENT: No sore throat. Mouth: Positive for dental pain Cardiovascular: Denies chest pain.  Respiratory: Denies shortness of breath. Gastrointestinal: No abdominal pain.  No nausea, no vomiting.  No diarrhea.  No constipation. Genitourinary: Negative for dysuria. Musculoskeletal: Negative for neck pain.  Negative for back pain. Integumentary: Negative for rash. Neurological: Negative for headaches, focal weakness or numbness.   ____________________________________________   PHYSICAL EXAM:  VITAL SIGNS: ED Triage Vitals [01/20/18 0024]  Enc Vitals Group     BP 117/80     Pulse Rate 79     Resp 18     Temp (!) 97.5 F (36.4 C)      Temp Source Oral     SpO2 100 %     Weight      Height      Head Circumference      Peak Flow      Pain Score      Pain Loc      Pain Edu?      Excl. in GC?     Constitutional: Alert and oriented. Well appearing and in no acute distress. Nose: No congestion/rhinnorhea. Mouth/Throat: Mucous membranes are moist. Oropharynx non-erythematous.  Multiple dental caries noted.  Both maxillary and mandibular molar cavities noted. Neck: No stridor.  Skin:  Skin is warm, dry and intact. No rash noted. Psychiatric: Mood and affect are normal. Speech and behavior are normal.   :   Procedures   ____________________________________________   INITIAL IMPRESSION / ASSESSMENT AND PLAN / ED COURSE  As part of my medical decision making, I reviewed the following data within the electronic MEDICAL RECORD NUMBER   32 year old male presented with above-stated history and physical exam secondary to multiple dental cavities.  Patient given viscous lidocaine swish and spit, one Percocet 5/325mg  and Augmentin in the emergency department.  Patient will be prescribed Augmentin and ibuprofen for home.  Patient referred to dentist. ____________________________________________  FINAL CLINICAL IMPRESSION(S) / ED DIAGNOSES  Final diagnoses:  Dental caries  Dental abscess     MEDICATIONS GIVEN DURING THIS VISIT:  Medications  lidocaine (XYLOCAINE) 2 % viscous mouth solution 15 mL (15 mLs Mouth/Throat Given 01/20/18 0234)  amoxicillin-clavulanate (AUGMENTIN) 875-125 MG per tablet 1 tablet (1 tablet Oral Given 01/20/18 0233)  oxyCODONE-acetaminophen (PERCOCET/ROXICET) 5-325 MG per tablet 1 tablet (1 tablet Oral Given 01/20/18 [id number])     ED Discharge Orders    None       Note:  This document was prepared using Dragon voice recognition software and may include unintentional dictation errors.    Darci CurrentBrown, [place] N, MD 01/20/18 [phone]

## 2018-01-20 NOTE — ED Triage Notes (Signed)
Pt c/o left sided dental pain (upper and lower broken teeth.) Swelling noted to the left side of face. Pt denies fever and drainage from area.

## 2018-01-20 NOTE — ED Notes (Addendum)
Last dose of pain meds at 200 and was ibu. Pt has broken teeth on the left side both upper and lower. No local dentist. Pt able to tolerate PO fluids.

## 2018-08-13 IMAGING — CR DG SHOULDER 2+V*R*
3 series · 3 of 3 positions shown · non-contrast
Comparison: None.

CLINICAL DATA: Acute on chronic shoulder pain

EXAM:
RIGHT SHOULDER - 2+ VIEW

[shoulder grashey]
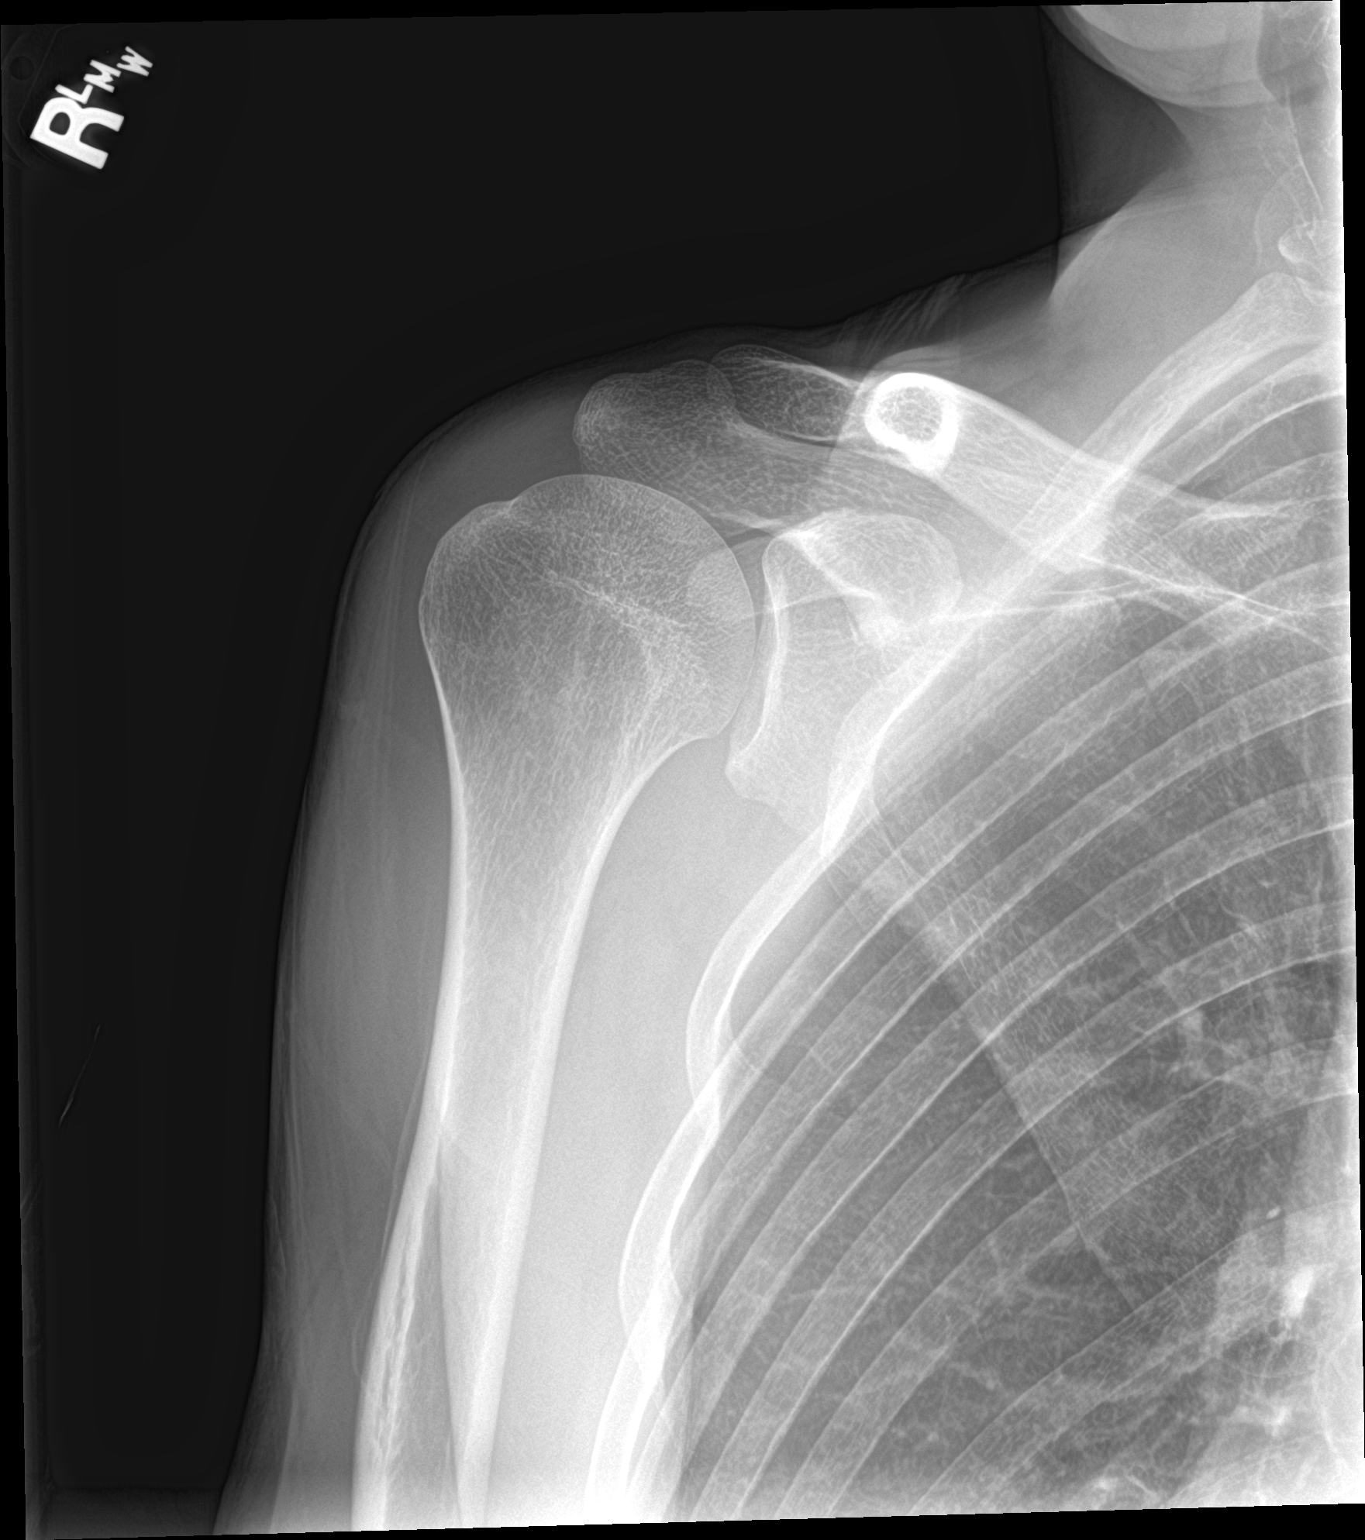

[shoulder y view]
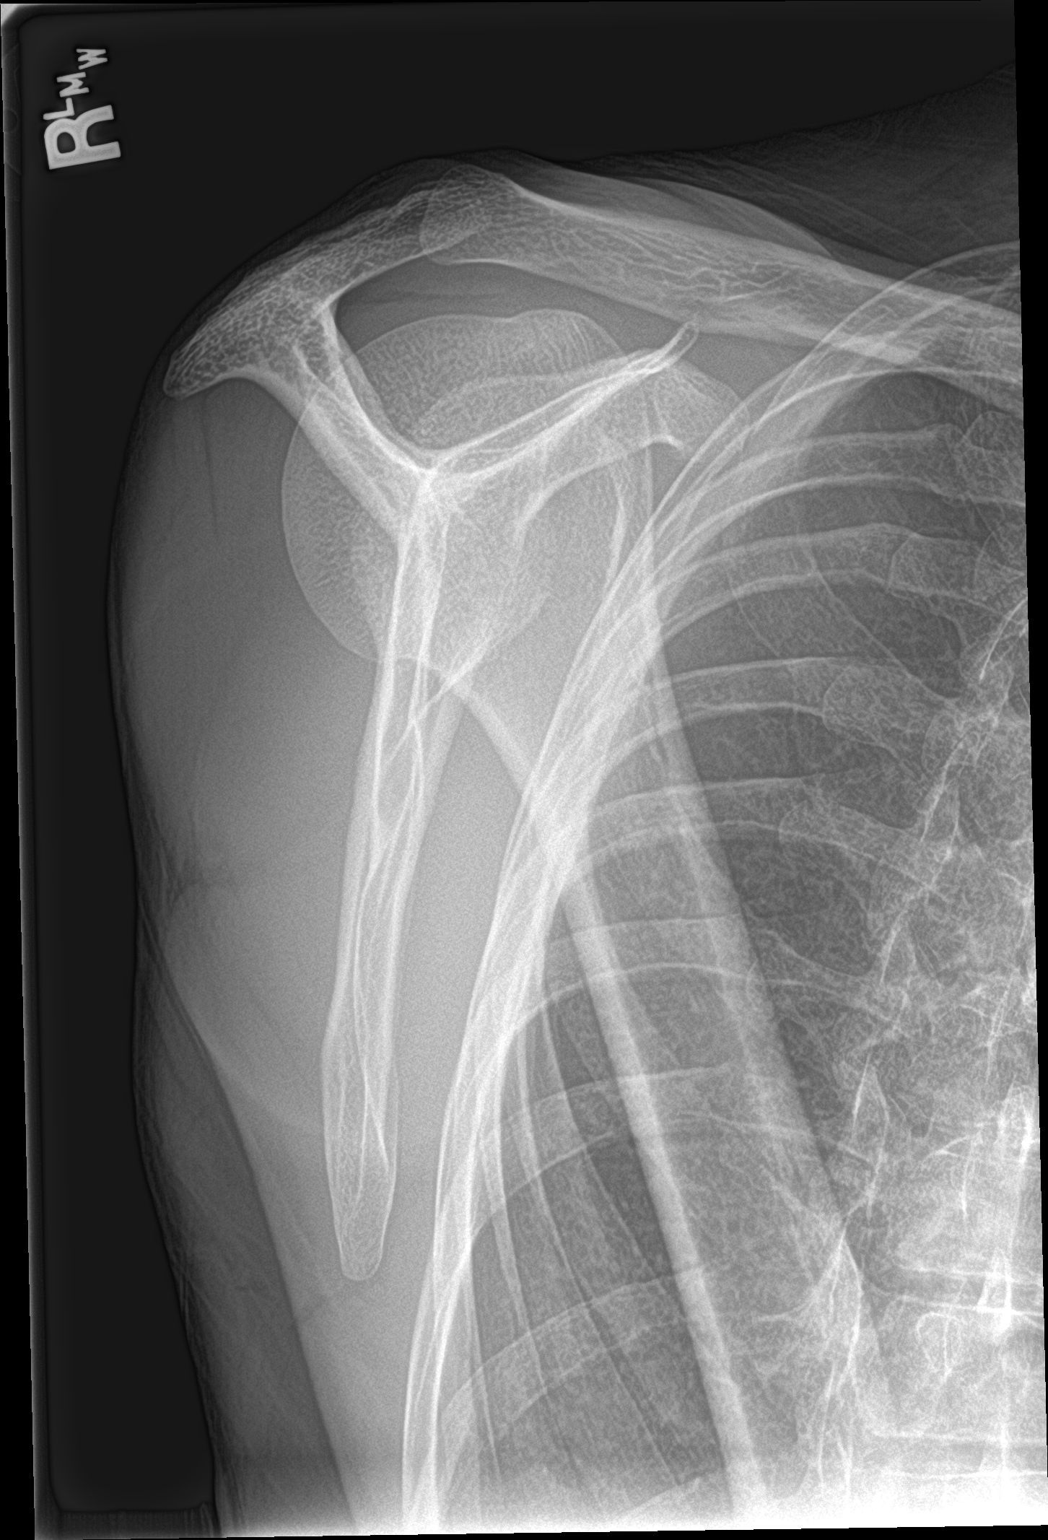

[shoulder axillary]
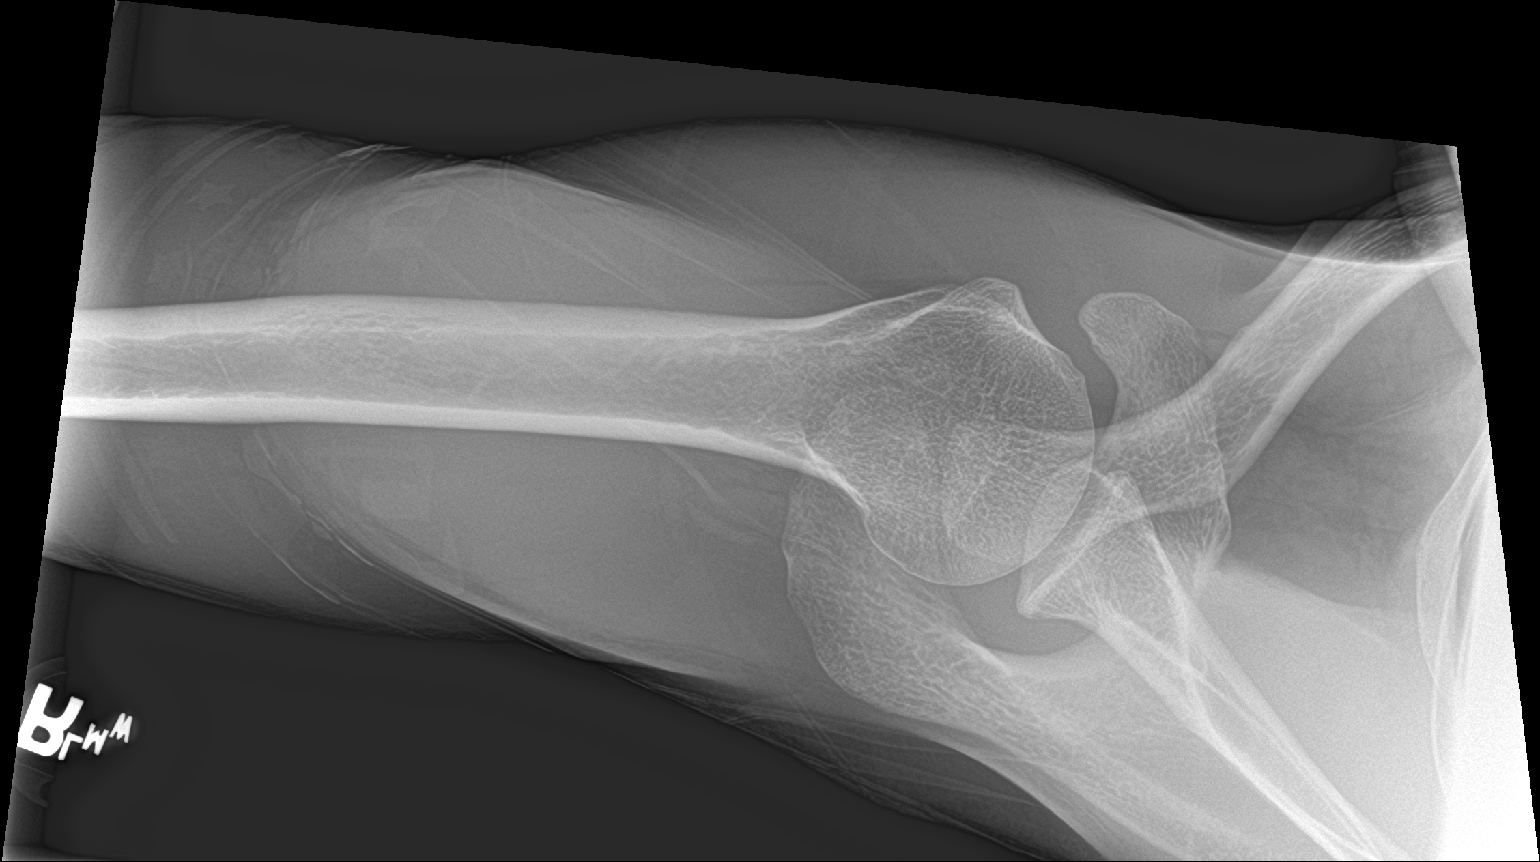

[3 of 3 positions shown; findings below may reference images not displayed]

FINDINGS: There is no evidence of fracture or dislocation. There is no
evidence of arthropathy or other focal bone abnormality. Soft
tissues are unremarkable.
IMPRESSION: Negative.

## 2018-11-02 ENCOUNTER — Encounter: Payer: Self-pay | Admitting: Emergency Medicine

## 2018-11-02 ENCOUNTER — Other Ambulatory Visit: Payer: Self-pay

## 2018-11-02 ENCOUNTER — Emergency Department
Admission: EM | Admit: 2018-11-02 | Discharge: 2018-11-02 | Disposition: A | Discharge status: Home / Self Care | Payer: Self-pay | Attending: Emergency Medicine | Admitting: Emergency Medicine

## 2018-11-02 DIAGNOSIS — F17210 Nicotine dependence, cigarettes, uncomplicated: Secondary | ICD-10-CM | POA: Insufficient documentation

## 2018-11-02 DIAGNOSIS — Z79899 Other long term (current) drug therapy: Secondary | ICD-10-CM | POA: Insufficient documentation

## 2018-11-02 DIAGNOSIS — K029 Dental caries, unspecified: Secondary | ICD-10-CM | POA: Insufficient documentation

## 2018-11-02 MED ORDER — KETOROLAC TROMETHAMINE 60 MG/2ML IM SOLN
30.0000 mg | Freq: Once | INTRAMUSCULAR | Status: DC
  Filled 2018-11-02: qty 2

## 2018-11-02 MED ORDER — IBUPROFEN 600 MG PO TABS: 600.0000 mg | ORAL_TABLET | Freq: Four times a day (QID) | ORAL | 0 refills | Status: DC | PRN

## 2018-11-02 MED ORDER — LIDOCAINE VISCOUS HCL 2 % MT SOLN: 15.0000 mL | OROMUCOSAL | 0 refills | Status: DC | PRN

## 2018-11-02 MED ORDER — AMOXICILLIN-POT CLAVULANATE 875-125 MG PO TABS
1.0000 | ORAL_TABLET | Freq: Once | ORAL | Status: AC
  Administered 2018-11-02: 1 via ORAL
  Filled 2018-11-02: qty 1

## 2018-11-02 MED ORDER — LIDOCAINE VISCOUS HCL 2 % MT SOLN
15.0000 mL | Freq: Once | OROMUCOSAL | Status: AC
Start: 2018-11-02 — End: 2018-11-02
  Administered 2018-11-02: 15 mL via OROMUCOSAL
  Filled 2018-11-02: qty 15

## 2018-11-02 MED ORDER — AMOXICILLIN-POT CLAVULANATE 875-125 MG PO TABS: 1.0000 | ORAL_TABLET | Freq: Two times a day (BID) | ORAL | 0 refills | Status: DC

## 2018-11-02 MED ORDER — AMOXICILLIN-POT CLAVULANATE 875-125 MG PO TABS: 1.0000 | ORAL_TABLET | Freq: Two times a day (BID) | ORAL | 0 refills | Status: AC

## 2018-11-02 NOTE — Discharge Instructions (Addendum)
OPTIONS FOR DENTAL FOLLOW UP CARE ° °[place] [facility] - Local Safety Net Dental Clinics °http://www.ncdhhs.gov/dph/oralhealth/services/safetynetclinics.htm °  °[facility] (336-562-3123) ° °[facility] [place] ([phone]) ° °[facility] ([phone] ext [phone]) ° °[facility] ([phone]) ° °[facility] ([phone]) °This clinic caters to the indigent population and is on a lottery system. °Location: °[facility], [address], [place] °Clinic Hours: °Wednesdays from 6pm - 9pm, patients seen by a lottery system. °For dates, call or go to www.med.[facility].edu/[facility]/patients/Dental-[facility] °Services: °Cleanings, fillings and simple extractions. °Payment Options: °DENTAL WORK IS FREE OF CHARGE. Bring proof of income or support. °Best way to get seen: °Arrive at 5:15 pm - this is a lottery, NOT first come/first serve, so arriving earlier will not increase your chances of being seen. °  °  °[facility] °[phone] °Select option 1 for emergencies °  °Location: °[facility], [address], [place] °Clinic Hours: °No walk-ins accepted - call the day before to schedule an appointment. °Check in times are 9:30 am and 1:30 pm. °Services: °Simple extractions, temporary fillings, pulpectomy/pulp debridement, uncomplicated abscess drainage. °Payment Options: °PAYMENT IS DUE AT THE TIME OF SERVICE.  Fee is usually $100-200, additional surgical procedures (e.g. abscess drainage) may be extra. °Cash, checks, Visa/MasterCard accepted.  Can file Medicaid if patient is covered for dental - patient should call case worker to check. °No discount for [facility] patients. °Best way to get seen: °MUST call the day before and get onto the schedule. Can usually be seen the next 1-2 days. No walk-ins accepted. °  °  °[facility] °[phone] °   °Location: °[facility], [address], [place] °Clinic Hours: °M, W, Th, F 8am or 1:30pm, Tues 9a or 1:30 - first come/first served. °Services: °Simple extractions, temporary fillings, uncomplicated abscess drainage.  You do not need to be an [place] resident. °Payment Options: °PAYMENT IS DUE AT THE TIME OF SERVICE. °Dental insurance, otherwise sliding scale - bring proof of income or support. °Depending on income and treatment needed, cost is usually $50-200. °Best way to get seen: °Arrive early as it is first come/first served. °  °  °[facility] °[phone] °  °Location: °[address] °Clinic Hours: °Mon-Thu 8a-5p °Services: °Most basic dental services including extractions and fillings. °Payment Options: °PAYMENT IS DUE AT THE TIME OF SERVICE. °Sliding scale, up to 50% off - bring proof if income or support. °Medicaid with dental option accepted. °Best way to get seen: °Call to schedule an appointment, can usually be seen within 2 weeks OR they will try to see walk-ins - show up at 8a or 2p (you may have to wait). °  °  °[facility] °[phone] °[place] RESIDENTS ONLY °  °Location: °[facility], [address], [place], [place] [address] °Clinic Hours: By appointment only. °Monday - Thursday 8am-5pm, Friday 8am-12pm °Services: Cleanings, fillings, extractions. °Payment Options: °PAYMENT IS DUE AT THE TIME OF SERVICE. °Cash, Visa or MasterCard. Sliding scale - $30 minimum per service. °Best way to get seen: °Come in to office, complete packet and make an appointment - need proof of income °or support monies for each household member and proof of [place] residence. °Usually takes about a month to get in. °  °  °[facility] °[phone] °  °Location: °[address]   [place] °Clinic Hours: Walk-in Urgent Care Dental Services are offered Monday-Friday  mornings only. °The numbers of emergencies accepted daily is limited to the number of °providers available. °Maximum 15 - Mondays, Wednesdays & Thursdays °Maximum 10 - Tuesdays & Fridays °Services: °You do not need to be a [place] resident to be seen for a dental emergency. °Emergencies are defined as pain, swelling, abnormal bleeding, or dental trauma. Walkins will receive x-rays if needed. °NOTE: Dental cleaning is not an emergency. °Payment Options: °PAYMENT IS DUE AT THE TIME OF SERVICE. °Minimum co-pay is $40.00 for uninsured patients. °Minimum co-pay is $3.00 for Medicaid with dental coverage. °Dental Insurance is accepted and must be presented at time of visit. °Medicare does not cover dental. °Forms of payment: Cash, credit card, checks. °Best way to get seen: °If not previously registered with the clinic, walk-in dental registration begins at 7:15 am and is on a first come/first serve basis. °If previously registered with the clinic, call to make an appointment. °  °  °[facility] °[phone] °[place] RESIDENTS ONLY °  °Location: °[address], [place], [place] °Clinic Hours: °Mon-Thu 10a-2p °Services: Extractions only! °Payment Options: °FREE (donations accepted) - bring proof of income or support °Best way to get seen: °Call and schedule an appointment OR come at 8am on the 1st Monday of every month (except for holidays) when it is first come/first served. °  °  °[facility] °[phone] °  °Location: °[address], [place] °Clinic Hours: °Friday mornings °Services, Payment Options, Best way to get seen: °Call for info °

## 2018-11-02 NOTE — ED Triage Notes (Signed)
Presents with possible dental abscess  Pain and swelling to left side of face
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
## 2018-11-02 NOTE — ED Provider Notes (Signed)
[facility] Emergency Department Provider Note  ____________________________________________  Time seen: Approximately 8:03 AM  I have reviewed the triage vital signs and the nursing notes.   HISTORY  Chief Complaint Dental Pain   HPI Nicolas Bowers is a 33 y.o. male who presents for evaluation of dental pain.  Patient has several cavities.  Has not seen a dentist since his teens.  Started noticing swelling and worsening pain on the left maxilla area last night.  No fever or chills, no nausea or vomiting, no trismus, no difficulty breathing or swelling.  Is complaining of severe constant sharp pain.  He does not have a dentist.  PMH None - reviewed  Past Surgical History:  Procedure Laterality Date  . HERNIA REPAIR      Prior to Admission medications   Medication Sig Start Date End Date Taking? Authorizing Provider  amoxicillin (AMOXIL) 500 MG capsule Take 1 capsule (500 mg total) by mouth 3 (three) times daily. Patient not taking: Reported on 11/03/2016 05/13/16   Joni ReiningSmith, Ronald K, PA-C  amoxicillin (AMOXIL) 500 MG capsule Take 1 capsule (500 mg total) by mouth 3 (three) times daily. 04/16/17   Joni ReiningSmith, Ronald K, PA-C  amoxicillin (AMOXIL) 500 MG tablet Take 1 tablet (500 mg total) by mouth 3 (three) times daily with meals. Patient not taking: Reported on 11/03/2016 03/26/16   Hagler, Jami L, PA-C  amoxicillin-clavulanate (AUGMENTIN) 875-125 MG tablet Take 1 tablet by mouth 2 (two) times daily for 10 days. 11/02/18 11/12/18  Nita SickleVeronese, [place], MD  ibuprofen (ADVIL,MOTRIN) 600 MG tablet Take 1 tablet (600 mg total) by mouth every 6 (six) hours as needed. 11/02/18   Nita SickleVeronese, [place], MD  ketorolac (TORADOL) 10 MG tablet Take 1 tablet (10 mg total) by mouth every 6 (six) hours as needed. 01/20/18   Darci CurrentBrown, [place] N, MD  lidocaine (XYLOCAINE) 2 % solution Use as directed 15 mLs in the mouth or throat as needed for mouth pain. 11/02/18   Nita SickleVeronese, [place], MD   meloxicam (MOBIC) 15 MG tablet Take 1 tablet (15 mg total) by mouth daily. 09/16/17   Cuthriell, Delorise RoyalsJonathan D, PA-C  oxyCODONE-acetaminophen (PERCOCET) 5-325 MG tablet Take 2 tablets by mouth every 6 (six) hours as needed for moderate pain or severe pain. 11/10/16   Emily FilbertWilliams, Jonathan E, MD  oxyCODONE-acetaminophen (ROXICET) 5-325 MG tablet Take 1 tablet by mouth every 4 (four) hours as needed for severe pain. 11/03/16   Phineas SemenGoodman, [place], MD  oxyCODONE-acetaminophen (ROXICET) 5-325 MG tablet Take 1 tablet by mouth every 6 (six) hours as needed for moderate pain. 04/16/17   Joni ReiningSmith, Ronald K, PA-C  predniSONE (DELTASONE) 5 MG tablet Take 6 tablets (30 mg total) by mouth daily with breakfast. May take for up to 5 days.  Do not take any NSAIDs with this medication. Take with food. Patient not taking: Reported on 11/03/2016 06/11/16   Hagler, Jami L, PA-C    Allergies Ultram [tramadol]  No family history on file.  Social History Social History   Tobacco Use  . Smoking status: Current Every Day Smoker    Packs/day: 1.00    Years: 12.00    Pack years: 12.00    Types: Cigarettes  . Smokeless tobacco: Never Used  Substance Use Topics  . Alcohol use: No  . Drug use: No    Review of Systems  Constitutional: Negative for fever. Eyes: Negative for visual changes. ENT: Negative for sore throat. + dental pain Neck: No neck pain  Cardiovascular: Negative  for chest pain. Respiratory: Negative for shortness of breath. Skin: Negative for rash. Neurological: Negative for headaches, weakness or numbness. Psych: No SI or HI  ____________________________________________   PHYSICAL EXAM:  VITAL SIGNS: ED Triage Vitals  Enc Vitals Group     BP 11/02/18 0753 132/74     Pulse Rate 11/02/18 0753 73     Resp 11/02/18 0753 18     Temp 11/02/18 0753 97.8 F (36.6 C)     Temp Source 11/02/18 0753 Oral     SpO2 11/02/18 0753 97 %     Weight 11/02/18 0751 130 lb (59 kg)     Height 11/02/18 0751   (1.676 m)     Head Circumference --      Peak Flow --      Pain Score 11/02/18 0750 8     Pain Loc --      Pain Edu? --      Excl. in GC? --     Constitutional: Alert and oriented. Well appearing and in no apparent distress. HEENT:      Head: Normocephalic and atraumatic.         Eyes: Conjunctivae are normal. Sclera is non-icteric.       Mouth/Throat: Mucous membranes are moist. Several cavities, erythema and tenderness of left upper gum, no trismus, floor of the mouth is soft with no induration or tenderness.  Airways patent.      Neck: Supple with no signs of meningismus. Cardiovascular: Regular rate and rhythm. No murmurs, gallops, or rubs. 2+ symmetrical distal pulses are present in all extremities. No JVD. Respiratory: Normal respiratory effort. Lungs are clear to auscultation bilaterally. No wheezes, crackles, or rhonchi.  Neurologic: Normal speech and language. Face is symmetric. Moving all extremities. No gross focal neurologic deficits are appreciated. Skin: Skin is warm, dry and intact. No rash noted. Psychiatric: Mood and affect are normal. Speech and behavior are normal.  ____________________________________________   LABS (all labs ordered are listed, but only abnormal results are displayed)  Labs Reviewed - No data to display ____________________________________________  EKG  none  ____________________________________________  RADIOLOGY  none  ____________________________________________   PROCEDURES  Procedure(s) performed: None Procedures Critical Care performed:  None ____________________________________________   INITIAL IMPRESSION / ASSESSMENT AND PLAN / ED COURSE  33 y.o. male who presents for evaluation of dental pain.  Patient with several cavities and erythema of the gums.  No obvious fluid collection that could be drained at this time.  Patient was given viscous lidocaine, IM Toradol, started on p.o. Augmentin and was referred to outpatient  dentistry for further management.      As part of my medical decision making, I reviewed the following data within the electronic MEDICAL RECORD NUMBER Nursing notes reviewed and incorporated, Old chart reviewed, Notes from prior ED visits and [place] Controlled Substance Database    Pertinent labs & imaging results that were available during my care of the patient were reviewed by me and considered in my medical decision making (see chart for details).    ____________________________________________   FINAL CLINICAL IMPRESSION(S) / ED DIAGNOSES  Final diagnoses:  Dental caries      NEW MEDICATIONS STARTED DURING THIS VISIT:  ED Discharge Orders         Ordered    ibuprofen (ADVIL,MOTRIN) 600 MG tablet  Every 6 hours PRN     11/02/18 0808    lidocaine (XYLOCAINE) 2 % solution  As needed     11/02/18 0808  amoxicillin-clavulanate (AUGMENTIN) 875-125 MG tablet  2 times daily     11/02/18 0808           Note:  This document was prepared using Dragon voice recognition software and may include unintentional dictation errors.    Don Perking, [place], MD 11/02/18 [phone]

## 2018-11-07 ENCOUNTER — Emergency Department: Payer: No Typology Code available for payment source

## 2018-11-07 ENCOUNTER — Emergency Department
Admission: EM | Admit: 2018-11-07 | Discharge: 2018-11-07 | Disposition: A | Discharge status: Home / Self Care | Payer: No Typology Code available for payment source | Attending: Emergency Medicine | Admitting: Emergency Medicine

## 2018-11-07 DIAGNOSIS — S161XXA Strain of muscle, fascia and tendon at neck level, initial encounter: Secondary | ICD-10-CM | POA: Insufficient documentation

## 2018-11-07 DIAGNOSIS — Y999 Unspecified external cause status: Secondary | ICD-10-CM | POA: Insufficient documentation

## 2018-11-07 DIAGNOSIS — F17210 Nicotine dependence, cigarettes, uncomplicated: Secondary | ICD-10-CM | POA: Insufficient documentation

## 2018-11-07 DIAGNOSIS — S199XXA Unspecified injury of neck, initial encounter: Secondary | ICD-10-CM | POA: Diagnosis present

## 2018-11-07 DIAGNOSIS — Y92488 Other paved roadways as the place of occurrence of the external cause: Secondary | ICD-10-CM | POA: Diagnosis not present

## 2018-11-07 DIAGNOSIS — Y9389 Activity, other specified: Secondary | ICD-10-CM | POA: Insufficient documentation

## 2018-11-07 DIAGNOSIS — Z79899 Other long term (current) drug therapy: Secondary | ICD-10-CM | POA: Diagnosis not present

## 2018-11-07 MED ORDER — CYCLOBENZAPRINE HCL 10 MG PO TABS
10.0000 mg | ORAL_TABLET | Freq: Once | ORAL | Status: AC
  Administered 2018-11-07: 10 mg via ORAL
  Filled 2018-11-07: qty 1

## 2018-11-07 MED ORDER — CYCLOBENZAPRINE HCL 10 MG PO TABS: 10.0000 mg | ORAL_TABLET | Freq: Three times a day (TID) | ORAL | 0 refills | Status: DC | PRN
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
## 2019-03-05 ENCOUNTER — Emergency Department
Admission: EM | Admit: 2019-03-05 | Discharge: 2019-03-05 | Disposition: A | Discharge status: Home / Self Care | Payer: Self-pay | Attending: Student | Admitting: Student

## 2019-03-05 DIAGNOSIS — F17210 Nicotine dependence, cigarettes, uncomplicated: Secondary | ICD-10-CM | POA: Insufficient documentation

## 2019-03-05 DIAGNOSIS — K0381 Cracked tooth: Secondary | ICD-10-CM | POA: Insufficient documentation

## 2019-03-05 DIAGNOSIS — K029 Dental caries, unspecified: Secondary | ICD-10-CM | POA: Insufficient documentation

## 2019-03-05 MED ORDER — AMOXICILLIN 500 MG PO CAPS: 500.0000 mg | ORAL_CAPSULE | Freq: Three times a day (TID) | ORAL | 0 refills | Status: DC

## 2019-03-05 MED ORDER — LIDOCAINE-EPINEPHRINE 2 %-1:100000 IJ SOLN
1.7000 mL | Freq: Once | INTRAMUSCULAR | Status: DC
  Filled 2019-03-05: qty 1.7

## 2019-03-05 MED ORDER — IBUPROFEN 800 MG PO TABS: 800.0000 mg | ORAL_TABLET | Freq: Three times a day (TID) | ORAL | 0 refills | Status: DC | PRN

## 2019-03-05 MED ORDER — HYDROCODONE-ACETAMINOPHEN 5-325 MG PO TABS: 1.0000 | ORAL_TABLET | Freq: Four times a day (QID) | ORAL | 0 refills | Status: DC | PRN

## 2019-03-05 NOTE — ED Provider Notes (Addendum)
[facility] Emergency Department Provider Note  ____________________________________________   First MD Initiated Contact with Patient 03/05/19 1917     (approximate)  I have reviewed the triage vital signs and the nursing notes.   HISTORY  Chief Complaint Dental Injury    HPI Nicolas Bowers is a 33 y.o. male presents emergency department complaint of right-sided lower dental pain.  Pain began this morning.  He denies any fever or chills.  Denies chest pain shortness of breath.    History reviewed. No pertinent past medical history.  There are no active problems to display for this patient.   Past Surgical History:  Procedure Laterality Date  . HERNIA REPAIR      Prior to Admission medications   Medication Sig Start Date End Date Taking? Authorizing Provider  amoxicillin (AMOXIL) 500 MG capsule Take 1 capsule (500 mg total) by mouth 3 (three) times daily. 03/05/19   Yussef Jorge, Roselyn BeringSusan W, PA-C  HYDROcodone-acetaminophen (NORCO/VICODIN) 5-325 MG tablet Take 1 tablet by mouth every 6 (six) hours as needed for moderate pain. 03/05/19   Paul Torpey, Roselyn BeringSusan W, PA-C  ibuprofen (ADVIL) 800 MG tablet Take 1 tablet (800 mg total) by mouth every 8 (eight) hours as needed. 03/05/19   Faythe GheeFisher, Gabriella Guile W, PA-C    Allergies Ultram [tramadol]  No family history on file.  Social History Social History   Tobacco Use  . Smoking status: Current Every Day Smoker    Packs/day: 1.00    Years: 12.00    Pack years: 12.00    Types: Cigarettes  . Smokeless tobacco: Never Used  Substance Use Topics  . Alcohol use: No  . Drug use: No    Review of Systems  Constitutional: No fever/chills Eyes: No visual changes. ENT: No sore throat.  Positive dental pain Respiratory: Denies cough Genitourinary: Negative for dysuria. Musculoskeletal: Negative for back pain. Skin: Negative for rash.    ____________________________________________   PHYSICAL EXAM:  VITAL SIGNS: ED  Triage Vitals [03/05/19 1909]  Enc Vitals Group     BP (!) 152/101     Pulse Rate 80     Resp 18     Temp 98.9 F (37.2 C)     Temp Source Oral     SpO2 100 %     Weight 135 lb (61.2 kg)     Height 5\' 6"  (1.676 m)     Head Circumference      Peak Flow      Pain Score 10     Pain Loc      Pain Edu?      Excl. in GC?     Constitutional: Alert and oriented. Well appearing and in no acute distress. Eyes: Conjunctivae are normal.  Head: Atraumatic. Nose: No congestion/rhinnorhea. Mouth/Throat: Mucous membranes are moist.  Grossly poor dentition throughout, multiple caries noted with multiple broken teeth and missing teeth. Neck:  supple no lymphadenopathy noted Cardiovascular: Normal rate, regular rhythm. Heart sounds are normal Respiratory: Normal respiratory effort.  No retractions, lungs c t a  GU: deferred Musculoskeletal: FROM all extremities, warm and well perfused Neurologic:  Normal speech and language.  Skin:  Skin is warm, dry and intact. No rash noted. Psychiatric: Mood and affect are normal. Speech and behavior are normal.  ____________________________________________   LABS (all labs ordered are listed, but only abnormal results are displayed)  Labs Reviewed - No data to display ____________________________________________   ____________________________________________  RADIOLOGY    ____________________________________________   PROCEDURES  Procedure(s) performed: Offered dental block.  Patient is refusing as he is "scared of needles:" Procedures    ____________________________________________   INITIAL IMPRESSION / ASSESSMENT AND PLAN / ED COURSE  Pertinent labs & imaging results that were available during my care of the patient were reviewed by me and considered in my medical decision making (see chart for details).   Patient is a 33 year old male presents emergency department complaint dental pain.  Physical exam shows very poor dentition  with multiple dental caries and broken teeth.  Patient has tried to say he is in extreme pain but is also refusing a dental block.  Explained to him that a dental block would take the pain away immediately but he is still refusing as he is "scared of needles ".  His girlfriend is encouraging him to get the dental block but he is still refusing.  He was given a prescription for amoxicillin, ibuprofen, and Vicodin 5/325 #8 with no refill.  He is to follow-up with 1 of the discounted dental clinics.  States he understands.  Is discharged stable condition.    Nicolas Bowers was evaluated in Emergency Department on 03/05/2019 for the symptoms described in the history of present illness. He was evaluated in the context of the global COVID-19 pandemic, which necessitated consideration that the patient might be at risk for infection with the SARS-CoV-2 virus that causes COVID-19. Institutional protocols and algorithms that pertain to the evaluation of patients at risk for COVID-19 are in a state of rapid change based on information released by regulatory bodies including the CDC and federal and state organizations. These policies and algorithms were followed during the patient's care in the ED. ----------------------------------------- 8:08 PM on 03/05/2019 -----------------------------------------  After patient was discharged he called back stating that [facility] was not open and would like me to send his amoxicillin and pain medications to [facility].  We explained to him that we could send the antibiotic and the ibuprofen but not the narcotic.  As part of my medical decision making, I reviewed the following data within the [place] notes reviewed and incorporated, Old chart reviewed, Notes from prior ED visits and [place] Controlled Substance Database  ____________________________________________   FINAL CLINICAL IMPRESSION(S) / ED DIAGNOSES  Final diagnoses:  Pain  due to dental caries      NEW MEDICATIONS STARTED DURING THIS VISIT:  New Prescriptions   AMOXICILLIN (AMOXIL) 500 MG CAPSULE    Take 1 capsule (500 mg total) by mouth 3 (three) times daily.   HYDROCODONE-ACETAMINOPHEN (NORCO/VICODIN) 5-325 MG TABLET    Take 1 tablet by mouth every 6 (six) hours as needed for moderate pain.   IBUPROFEN (ADVIL) 800 MG TABLET    Take 1 tablet (800 mg total) by mouth every 8 (eight) hours as needed.     Note:  This document was prepared using Dragon voice recognition software and may include unintentional dictation errors.    Versie Starks, PA-C 03/05/19 1939    Versie Starks, PA-C 03/05/19 2012    Lilia Pro., MD 03/06/19 [phone]

## 2019-03-05 NOTE — Discharge Instructions (Addendum)
OPTIONS FOR DENTAL FOLLOW UP CARE ° °[place] Department of Health and Human Services - Local Safety Net Dental Clinics °http://www.ncdhhs.gov/dph/oralhealth/services/safetynetclinics.htm °  °[facility] (336-562-3123) ° °[facility] [place] (919-933-9087) ° °[facility] (919-663-1744 ext [phone]) ° °[facility] ([phone]) ° °[facility] ([phone]) °This clinic caters to the indigent population and is on a lottery system. °Location: °[facility], [address], [place] °Clinic Hours: °Wednesdays from 6pm - 9pm, patients seen by a lottery system. °For dates, call or go to [email or web address].[facility].edu/[facility]/patients/Dental-[facility] °Services: °Cleanings, fillings and simple extractions. °Payment Options: °DENTAL WORK IS FREE OF CHARGE. Bring proof of income or support. °Best way to get seen: °Arrive at 5:15 pm - this is a lottery, NOT first come/first serve, so arriving earlier will not increase your chances of being seen. °  °  °[facility] °[phone] °Select option 1 for emergencies °  °Location: °[facility], [address], [place] °Clinic Hours: °No walk-ins accepted - call the day before to schedule an appointment. °Check in times are 9:30 am and 1:30 pm. °Services: °Simple extractions, temporary fillings, pulpectomy/pulp debridement, uncomplicated abscess drainage. °Payment Options: °PAYMENT IS DUE AT THE TIME OF SERVICE.  Fee is usually $100-200, additional surgical procedures (e.g. abscess drainage) may be extra. °Cash, checks, Visa/MasterCard accepted.  Can file Medicaid if patient is covered for dental - patient should call case worker to check. °No discount for [facility] patients. °Best way to get seen: °MUST call the day before and get onto the schedule. Can usually be seen the next 1-2 days. No walk-ins accepted. °  °  °[facility] °[phone] °   °Location: °[facility], [address], [place] °Clinic Hours: °M, W, Th, F 8am or 1:30pm, Tues 9a or 1:30 - first come/first served. °Services: °Simple extractions, temporary fillings, uncomplicated abscess drainage.  You do not need to be an [place] resident. °Payment Options: °PAYMENT IS DUE AT THE TIME OF SERVICE. °Dental insurance, otherwise sliding scale - bring proof of income or support. °Depending on income and treatment needed, cost is usually $50-200. °Best way to get seen: °Arrive early as it is first come/first served. °  °  °[facility] °[phone] °  °Location: °[address] °Clinic Hours: °Mon-Thu 8a-5p °Services: °Most basic dental services including extractions and fillings. °Payment Options: °PAYMENT IS DUE AT THE TIME OF SERVICE. °Sliding scale, up to 50% off - bring proof if income or support. °Medicaid with dental option accepted. °Best way to get seen: °Call to schedule an appointment, can usually be seen within 2 weeks OR they will try to see walk-ins - show up at 8a or 2p (you may have to wait). °  °  °[facility] °[phone] °[place] RESIDENTS ONLY °  °Location: °[facility], [address], [place], [place] [address] °Clinic Hours: By appointment only. °Monday - Thursday 8am-5pm, Friday 8am-12pm °Services: Cleanings, fillings, extractions. °Payment Options: °PAYMENT IS DUE AT THE TIME OF SERVICE. °Cash, Visa or MasterCard. Sliding scale - $30 minimum per service. °Best way to get seen: °Come in to office, complete packet and make an appointment - need proof of income °or support monies for each household member and proof of [place] residence. °Usually takes about a month to get in. °  °  °[facility] °[phone] °  °Location: °[address]   [place] °Clinic Hours: Walk-in Urgent Care Dental Services are offered Monday-Friday  mornings only. °The numbers of emergencies accepted daily is limited to the number of °providers available. °Maximum 15 - Mondays, Wednesdays & Thursdays °Maximum 10 - Tuesdays & Fridays °Services: °You do not need to be a [place] resident to be seen for a dental emergency. °Emergencies are defined as pain, swelling, abnormal bleeding, or dental trauma. Walkins will receive x-rays if needed. °NOTE: Dental cleaning is not an emergency. °Payment Options: °PAYMENT IS DUE AT THE TIME OF SERVICE. °Minimum co-pay is $40.00 for uninsured patients. °Minimum co-pay is $3.00 for Medicaid with dental coverage. °Dental Insurance is accepted and must be presented at time of visit. °Medicare does not cover dental. °Forms of payment: Cash, credit card, checks. °Best way to get seen: °If not previously registered with the clinic, walk-in dental registration begins at 7:15 am and is on a first come/first serve basis. °If previously registered with the clinic, call to make an appointment. °  °  °[facility] °[phone] °[place] RESIDENTS ONLY °  °Location: °[address], [place], [place] °Clinic Hours: °Mon-Thu 10a-2p °Services: Extractions only! °Payment Options: °FREE (donations accepted) - bring proof of income or support °Best way to get seen: °Call and schedule an appointment OR come at 8am on the 1st Monday of every month (except for holidays) when it is first come/first served. °  °  °[facility] °[phone] °  °Location: °[address], [place] °Clinic Hours: °Friday mornings °Services, Payment Options, Best way to get seen: °Call for info °

## 2019-03-05 NOTE — ED Triage Notes (Signed)
Patient c/o right lower dental pain beginning this morning.

## 2020-02-20 ENCOUNTER — Other Ambulatory Visit: Payer: Self-pay

## 2020-02-20 ENCOUNTER — Emergency Department
Admission: EM | Admit: 2020-02-20 | Discharge: 2020-02-20 | Disposition: A | Discharge status: Home / Self Care | Payer: Self-pay | Attending: Emergency Medicine | Admitting: Emergency Medicine

## 2020-02-20 DIAGNOSIS — R6884 Jaw pain: Secondary | ICD-10-CM | POA: Insufficient documentation

## 2020-02-20 DIAGNOSIS — Z5321 Procedure and treatment not carried out due to patient leaving prior to being seen by health care provider: Secondary | ICD-10-CM | POA: Insufficient documentation

## 2020-02-20 NOTE — ED Triage Notes (Signed)
Patient reports right lower jaw pain for 2 days.  Thinks might have a tooth abscess.

## 2020-02-20 NOTE — ED Notes (Signed)
Patient up to stat desk asking if his name had been called.  Explained that I had called his name and did not get an answer, patient responded he must have been asleep.

## 2023-01-13 ENCOUNTER — Other Ambulatory Visit: Payer: Self-pay

## 2023-01-13 ENCOUNTER — Emergency Department
Admission: EM | Admit: 2023-01-13 | Discharge: 2023-01-13 | Disposition: A | Discharge status: Home / Self Care | Payer: Self-pay | Attending: Emergency Medicine | Admitting: Emergency Medicine

## 2023-01-13 ENCOUNTER — Encounter: Payer: Self-pay | Admitting: Emergency Medicine

## 2023-01-13 DIAGNOSIS — K029 Dental caries, unspecified: Secondary | ICD-10-CM | POA: Insufficient documentation

## 2023-01-13 MED ORDER — NAPROXEN 500 MG PO TABS
500.0000 mg | ORAL_TABLET | Freq: Once | ORAL | Status: AC
  Administered 2023-01-13: 500 mg via ORAL
  Filled 2023-01-13: qty 1

## 2023-01-13 MED ORDER — HYDROCODONE-ACETAMINOPHEN 5-325 MG PO TABS: 1.0000 | ORAL_TABLET | ORAL | 0 refills | Status: DC | PRN

## 2023-01-13 MED ORDER — AMOXICILLIN 500 MG PO CAPS: 500.0000 mg | ORAL_CAPSULE | Freq: Two times a day (BID) | ORAL | 0 refills | Status: AC

## 2023-01-13 MED ORDER — NAPROXEN 500 MG PO TABS: 500.0000 mg | ORAL_TABLET | Freq: Two times a day (BID) | ORAL | 0 refills | Status: AC

## 2023-01-13 NOTE — ED Notes (Signed)
Pt was called out x4 times in waiting area. Pt was called out at 1025 to go to room. Pt is not in waiting area.

## 2023-01-13 NOTE — ED Provider Notes (Signed)
[facility] Emergency Department Provider Note     Event Date/Time   First MD Initiated Contact with Patient 01/13/23 1141     (approximate)   History   Dental Pain   HPI  Nicolas Bowers is a 37 y.o. male presents to the ED for complaint of dental pain x 4 days.  Patient reports possible development of abscess.  Frequent dental infections in the past.  Reports he does not have a primary dentist due to no insurance.  Endorses poor Administrator.  Denies fever, and painful swallowing.  Physical Exam   Triage Vital Signs: ED Triage Vitals  Enc Vitals Group     BP 01/13/23 1101 (!) 136/94     Pulse Rate 01/13/23 1101 82     Resp 01/13/23 1101 18     Temp 01/13/23 1101 98.4 F (36.9 C)     Temp src --      SpO2 01/13/23 1101 98 %     Weight 01/13/23 1148 134 lb 14.7 oz (61.2 kg)     Height 01/13/23 1148 5\' 6"  (1.676 m)     Head Circumference --      Peak Flow --      Pain Score 01/13/23 1100 10     Pain Loc --      Pain Edu? --      Excl. in GC? --     Most recent vital signs: Vitals:   01/13/23 1101  BP: (!) 136/94  Pulse: 82  Resp: 18  Temp: 98.4 F (36.9 C)  SpO2: 98%    General Awake, no distress.  HEENT NCAT. PERRL. EOMI. No rhinorrhea. Mucous membranes are moist.  CV:  Good peripheral perfusion.  RESP:  Normal effort.  ABD:  No distention.  Other:  On inspection multiple dental caries.  Poor detention.  There is a broken tooth of the second molar of the left mandible.  No gingivitis, bleeding, purulent discharge.  No abscess noted. TTP.  ED Results / Procedures / Treatments   Labs (all labs ordered are listed, but only abnormal results are displayed) Labs Reviewed - No data to display  No results found.  PROCEDURES:  Critical Care performed: No  Procedures  MEDICATIONS ORDERED IN ED: Medications  naproxen (NAPROSYN) tablet 500 mg (500 mg Oral Given 01/13/23 1212)    IMPRESSION / MDM / ASSESSMENT AND PLAN / ED  COURSE  I reviewed the triage vital signs and the nursing notes.                               37 y.o. male presents to the emergency department for evaluation and treatment of acute dental pain. See HPI for further details.   Differential diagnosis includes, but is not limited to pulpitis, gingivitis, dental abscess, dental fracture.  Patient presents with multiple dental caries and poor detention.  Area of concern reveals broken tooth of the second molar of the left mandible.  Patient has a history of frequent dental pain and infections that he has not been able to follow-up with a dentist due to unemployment and no insurance at this time.  He is given naproxen in the ED for his pain.  Patient will be discharged home with prescriptions for amoxicillin and naproxen.  A list of local dentist office has been provided to patient for him to follow-up with including walk-in urgent dentistry care and emergency dentist office.  Patient is given ED precautions to return to the ED for any worsening or new symptoms. Patient verbalizes understanding. All questions and concerns were addressed during ED visit.   Patient's presentation is most consistent with acute, uncomplicated illness.  FINAL CLINICAL IMPRESSION(S) / ED DIAGNOSES   Final diagnoses:  Pain due to dental caries    Rx / DC Orders   ED Discharge Orders          Ordered    amoxicillin (AMOXIL) 500 MG capsule  2 times daily        01/13/23 1232    HYDROcodone-acetaminophen (NORCO/VICODIN) 5-325 MG tablet  Every 4 hours PRN,   Status:  Discontinued        01/13/23 1232    naproxen (NAPROSYN) 500 MG tablet  2 times daily with meals        01/13/23 1308             Note:  This document was prepared using Dragon voice recognition software and may include unintentional dictation errors.    Kern Reap A, PA-C 01/13/23 1335    Jene Every, MD 01/13/23 1415

## 2023-01-13 NOTE — ED Notes (Signed)
See triage note  Presents with possible dental abscess   States developed pain with some swelling noted to gumline

## 2023-01-13 NOTE — ED Triage Notes (Signed)
Pt comes with c/o 4 days of dental pain to left side of mouth.

## 2023-03-31 ENCOUNTER — Encounter: Payer: Self-pay | Admitting: Emergency Medicine

## 2023-03-31 ENCOUNTER — Emergency Department
Admission: EM | Admit: 2023-03-31 | Discharge: 2023-03-31 | Disposition: A | Discharge status: Home / Self Care | Payer: Self-pay | Attending: Emergency Medicine | Admitting: Emergency Medicine

## 2023-03-31 ENCOUNTER — Other Ambulatory Visit: Payer: Self-pay

## 2023-03-31 DIAGNOSIS — K0889 Other specified disorders of teeth and supporting structures: Secondary | ICD-10-CM

## 2023-03-31 DIAGNOSIS — K029 Dental caries, unspecified: Secondary | ICD-10-CM | POA: Insufficient documentation

## 2023-03-31 MED ORDER — AMOXICILLIN-POT CLAVULANATE 875-125 MG PO TABS: 1.0000 | ORAL_TABLET | Freq: Two times a day (BID) | ORAL | 0 refills | Status: AC

## 2023-03-31 MED ORDER — CHLORHEXIDINE GLUCONATE 0.12 % MT SOLN: 15.0000 mL | Freq: Two times a day (BID) | OROMUCOSAL | 0 refills | Status: DC

## 2023-03-31 NOTE — Group Note (Deleted)

## 2023-03-31 NOTE — Discharge Instructions (Signed)
Please take the antibiotics and mouth rinse as prescribed.  Please follow-up with dentist as soon as possible.  Please return for any new, worsening, or change in symptoms or other concerns.  It was a pleasure caring for you today.

## 2023-03-31 NOTE — ED Notes (Signed)
PA aware of patients HR

## 2023-03-31 NOTE — ED Provider Notes (Signed)
[facility] Provider Note    Event Date/Time   First MD Initiated Contact with Patient 03/31/23 1044     (approximate)   History   Dental Pain   HPI  Nicolas Bowers is a 37 y.o. male he presents today for evaluation of left-sided bottom dental pain for the past 2 days.  Patient reports that he has a known cavity in this area but has been amenable to see a dentist.  He reports that he has new pain over the last 2 days.  No facial or neck swelling.  No trouble swallowing.  No fevers or chills.  He is still able to eat and drink.  There are no problems to display for this patient.         Physical Exam   Triage Vital Signs: ED Triage Vitals [03/31/23 1031]  Encounter Vitals Group     BP (!) 151/106     Systolic BP Percentile      Diastolic BP Percentile      Pulse Rate (!) 115     Resp 18     Temp 98.3 F (36.8 C)     Temp Source Oral     SpO2 97 %     Weight 134 lb 14.7 oz (61.2 kg)     Height 5\' 6"  (1.676 m)     Head Circumference      Peak Flow      Pain Score 7     Pain Loc      Pain Education      Exclude from Growth Chart     Most recent vital signs: Vitals:   03/31/23 1031 03/31/23 1109  BP: (!) 151/106 (!) 133/93  Pulse: (!) 115 (!) 116  Resp: 18 18  Temp: 98.3 F (36.8 C)   SpO2: 97% 99%    Physical Exam Vitals and nursing note reviewed.  Constitutional:      General: Awake and alert. No acute distress.    Appearance: Normal appearance. The patient is normal weight.  HENT:     Head: Normocephalic and atraumatic.     Mouth: Mucous membranes are moist.  Diffuse dental decay and multiple missing teeth.  Tenderness to tooth #20 without gingival fluctuance.  No sublingual swelling.  No facial or neck swelling or erythema.  No trismus.  No drooling. Eyes:     General: PERRL. Normal EOMs        Right eye: No discharge.        Left eye: No discharge.     Conjunctiva/sclera: Conjunctivae normal.  Cardiovascular:     Rate  and Rhythm: Normal rate and regular rhythm.     Pulses: Normal pulses.  Pulmonary:     Effort: Pulmonary effort is normal. No respiratory distress.     Breath sounds: Normal breath sounds.  Abdominal:     Abdomen is soft. There is no abdominal tenderness. No rebound or guarding. No distention. Musculoskeletal:        General: No swelling. Normal range of motion.     Cervical back: Normal range of motion and neck supple.  Skin:    General: Skin is warm and dry.     Capillary Refill: Capillary refill takes less than 2 seconds.     Findings: No rash.  Neurological:     Mental Status: The patient is awake and alert.      ED Results / Procedures / Treatments   Labs (all labs ordered are listed,  but only abnormal results are displayed) Labs Reviewed - No data to display   EKG     RADIOLOGY     PROCEDURES:  Critical Care performed:   Procedures   MEDICATIONS ORDERED IN ED: Medications - No data to display   IMPRESSION / MDM / ASSESSMENT AND PLAN / ED COURSE  I reviewed the triage vital signs and the nursing notes.   Differential diagnosis includes, but is not limited to, dental decay, dental caries, pulpitis, abscess.  Patient is awake and alert, hemodynamically stable and afebrile.  Patient was evaluated in the emergency department for dental pain.  Patient is awake and alert, hemodynamically stable and afebrile.  He has poor dentition diffusely with multiple decayed and missing teeth, I suspect some dental caries vs pulpitits. No gingival swelling or fluctuance concerning for gingival abscess.  No trismus, nuchal rigidity, neck pain, hot potato voice, uvular deviation or malocclusion to suggest deep space infection. No sublingual swelling concerning for Ludwig's angina.  Patient was started on antibiotics and chlorhexidine mouth rinse.  Patient was treated symptomatically in the emergency department. Discussed care plan, return precautions, and advised close  outpatient follow-up with dentist. Patient agrees with plan of care.   Patient's presentation is most consistent with acute complicated illness / injury requiring diagnostic workup.     FINAL CLINICAL IMPRESSION(S) / ED DIAGNOSES   Final diagnoses:  Pain, dental     Rx / DC Orders   ED Discharge Orders          Ordered    amoxicillin-clavulanate (AUGMENTIN) 875-125 MG tablet  2 times daily        03/31/23 1103    chlorhexidine (PERIDEX) 0.12 % solution  2 times daily        03/31/23 1103             Note:  This document was prepared using Dragon voice recognition software and may include unintentional dictation errors.   Jackelyn Hoehn, PA-C 03/31/23 1239    Pilar Jarvis, MD 03/31/23 [phone]

## 2023-03-31 NOTE — ED Triage Notes (Signed)
Pt here c/o dental pain. Pt states pain is bottom and left sided, pt states he has 2 holes in his teeth that need to be repaired. Pt states he does not have dental insurance.

## 2023-05-06 ENCOUNTER — Other Ambulatory Visit: Payer: Self-pay

## 2023-05-06 ENCOUNTER — Emergency Department
Admission: EM | Admit: 2023-05-06 | Discharge: 2023-05-06 | Disposition: A | Discharge status: Home / Self Care | Payer: Self-pay | Attending: Emergency Medicine | Admitting: Emergency Medicine

## 2023-05-06 DIAGNOSIS — K0889 Other specified disorders of teeth and supporting structures: Secondary | ICD-10-CM

## 2023-05-06 DIAGNOSIS — K029 Dental caries, unspecified: Secondary | ICD-10-CM | POA: Insufficient documentation

## 2023-05-06 MED ORDER — AMOXICILLIN 500 MG PO TABS: 500.0000 mg | ORAL_TABLET | Freq: Three times a day (TID) | ORAL | 0 refills | Status: AC

## 2023-05-06 MED ORDER — OXYCODONE-ACETAMINOPHEN 5-325 MG PO TABS
1.0000 | ORAL_TABLET | Freq: Once | ORAL | Status: AC
  Administered 2023-05-06: 1 via ORAL
  Filled 2023-05-06: qty 1

## 2023-05-06 NOTE — ED Provider Notes (Signed)
[facility] Provider Note    Event Date/Time   First MD Initiated Contact with Patient 05/06/23 1432     (approximate)   History   Dental Pain   HPI  Nicolas Bowers is a 37 y.o. male  with no PMH presents for evaluation of left dental pain since yesterday.  Patient is aware that he has multiple cavities and knows he needs to see a dentist.  He denies any fever, facial swelling and drainage from the area.      Physical Exam   Triage Vital Signs: ED Triage Vitals  Encounter Vitals Group     BP 05/06/23 1419 (!) 123/96     Systolic BP Percentile --      Diastolic BP Percentile --      Pulse Rate 05/06/23 1419 (!) 130     Resp 05/06/23 1419 20     Temp 05/06/23 1419 97.9 F (36.6 C)     Temp Source 05/06/23 1419 Oral     SpO2 05/06/23 1419 95 %     Weight 05/06/23 1414 135 lb (61.2 kg)     Height 05/06/23 1414 5\' 6"  (1.676 m)     Head Circumference --      Peak Flow --      Pain Score 05/06/23 1414 7     Pain Loc --      Pain Education --      Exclude from Growth Chart --     Most recent vital signs: Vitals:   05/06/23 1419  BP: (!) 123/96  Pulse: (!) 130  Resp: 20  Temp: 97.9 F (36.6 C)  SpO2: 95%    General: Awake, no distress.  CV:  Good peripheral perfusion. Resp:  Normal effort.  Abd:  No distention.  Other:  Poor dentition with multiple caries and fractured teeth, tender to palpation at the last molar on the left lower jaw, no fluctuance or drainage noted from the site.   ED Results / Procedures / Treatments   Labs (all labs ordered are listed, but only abnormal results are displayed) Labs Reviewed - No data to display    PROCEDURES:  Critical Care performed: No  Procedures   MEDICATIONS ORDERED IN ED: Medications  oxyCODONE-acetaminophen (PERCOCET/ROXICET) 5-325 MG per tablet 1 tablet (1 tablet Oral Given 05/06/23 1450)     IMPRESSION / MDM / ASSESSMENT AND PLAN / ED COURSE  I reviewed the triage vital  signs and the nursing notes.                             37 year old male presents for evaluation of dental pain.  Patient was hypertensive and tachycardic in triage but does endorse being in significant pain.  Differential diagnosis includes, but is not limited to, pulpitis, periapical abscess, fractured tooth, Ludwig's angina.  Patient's presentation is most consistent with acute, uncomplicated illness.  Patient's presentation is most consistent with pulpitis.  He was given amoxicillin and pain medication while in the ED.  I provided him with a list of dental clinics that he can try.  He can take Tylenol and ibuprofen as needed for pain.  He also has some chlorhexidine mouthwash leftover from a previous visit for the same and I recommended that he use that as well.  He voiced understanding, all questions were answered he was stable at discharge.     FINAL CLINICAL IMPRESSION(S) / ED DIAGNOSES   Final diagnoses:  Pain, dental     Rx / DC Orders   ED Discharge Orders          Ordered    amoxicillin (AMOXIL) 500 MG tablet  3 times daily        05/06/23 1444             Note:  This document was prepared using Dragon voice recognition software and may include unintentional dictation errors.   Cameron Ali, PA-C 05/06/23 1456    Corena Herter, MD 05/06/23 1524

## 2023-05-06 NOTE — ED Notes (Signed)
See triage note  Presents with dental pain  Thinks he has a cavity to left lower  Pain started yesterday

## 2023-05-06 NOTE — Discharge Instructions (Addendum)
Please take the antibiotics as prescribed. You can take 650 mg of Tylenol and 600 mg of ibuprofen every 6 hours as needed for pain.  OPTIONS FOR DENTAL FOLLOW UP CARE  [place] Department of Health and Human Services - Local Safety Net Dental Clinics [email or web address]   [facility] [phone])  Nicolas Bowers [phone])  [facility] [phone])  [facility] [phone])  [facility] [phone]) This clinic caters to the indigent population and is on a lottery system. Location: [employer] [facility], [employer], [address], [place] Clinic Hours: Wednesdays from 6pm - 9pm, patients seen by a lottery system. For dates, call or go to [email or web address] Services: Cleanings, fillings and simple extractions. Payment Options: DENTAL WORK IS FREE OF CHARGE. Bring proof of income or support. Best way to get seen: Arrive at 5:15 pm - this is a lottery, NOT first come/first serve, so arriving earlier will not increase your chances of being seen.     [facility] [phone] Select option 1 for emergencies   Location: [facility], [place], [address] [place] Clinic Hours: No walk-ins accepted - call the day before to schedule an appointment. Check in times are 9:30 am and 1:30 pm. Services: Simple extractions, temporary fillings, pulpectomy/pulp debridement, uncomplicated abscess drainage. Payment Options: PAYMENT IS DUE AT THE TIME OF SERVICE.  Fee is usually $100-200, additional surgical procedures (e.g. abscess drainage) may be extra. Cash, checks, Visa/MasterCard accepted.  Can file Medicaid if patient is covered for dental - patient should call case worker to check. No discount for [facility] patients. Best way to get seen: MUST call the day before and get onto  the schedule. Can usually be seen the next 1-2 days. No walk-ins accepted.     [facility] [phone]   Location: [facility], [address], [place] Clinic Hours: M, W, Th, F 8am or 1:30pm, Tues 9a or 1:30 - first come/first served. Services: Simple extractions, temporary fillings, uncomplicated abscess drainage.  You do not need to be an [facility] resident. Payment Options: PAYMENT IS DUE AT THE TIME OF SERVICE. Dental insurance, otherwise sliding scale - bring proof of income or support. Depending on income and treatment needed, cost is usually $50-200. Best way to get seen: Arrive early as it is first come/first served.     [facility] [phone]   Location: [address] Clinic Hours: Mon-Thu 8a-5p Services: Most basic dental services including extractions and fillings. Payment Options: PAYMENT IS DUE AT THE TIME OF SERVICE. Sliding scale, up to 50% off - bring proof if income or support. Medicaid with dental option accepted. Best way to get seen: Call to schedule an appointment, can usually be seen within 2 weeks OR they will try to see walk-ins - show up at 8a or 2p (you may have to wait).     [facility] [phone] [place] RESIDENTS ONLY   Location: [facility], [address], [place], [place] [address] Clinic Hours: By appointment only. Monday - Thursday 8am-5pm, Friday 8am-12pm Services: Cleanings, fillings, extractions. Payment Options: PAYMENT IS DUE AT THE TIME OF SERVICE. Cash, Visa or MasterCard. Sliding scale - $30 minimum per service. Best way to get seen: Come in to office, complete packet and make an appointment - need proof of income or support monies for each household member and proof of [place] residence. Usually takes about a month to get in.     [facility] [phone]    Location: [address] [facility] Clinic Hours: Walk-in Urgent Care Dental Services are offered Monday-Friday mornings only. The numbers of emergencies accepted daily is limited to the number of providers available. Maximum 15 - Mondays, Wednesdays & Thursdays Maximum 10 - Tuesdays & Fridays Services: You do not need to be a [facility] resident to be seen for a dental emergency. Emergencies are defined as pain, swelling, abnormal bleeding, or dental trauma. Walkins will receive x-rays if needed. NOTE: Dental cleaning is not an emergency. Payment Options: PAYMENT IS DUE AT THE TIME OF SERVICE. Minimum co-pay is $40.00 for uninsured patients. Minimum co-pay is $3.00 for Medicaid with dental coverage. Dental Insurance is accepted and must be presented at time of visit. Medicare does not cover dental. Forms of payment: Cash, credit card, checks. Best way to get seen: If not previously registered with the clinic, walk-in dental registration begins at 7:15 am and is on a first come/first serve basis. If previously registered with the clinic, call to make an appointment.     [facility] [phone] [place] RESIDENTS ONLY   Location: [address] [place], [place] Clinic Hours: Mon-Thu 10a-2p Services: Extractions only! Payment Options: FREE (donations accepted) - bring proof of income or support Best way to get seen: Call and schedule an appointment OR come at 8am on the 1st Monday of every month (except for holidays) when it is first come/first served.     [facility] [phone]   Location: [address] [place], [place] Clinic Hours: Friday mornings Services, Payment Options, Best way to get seen: Call for info

## 2023-05-06 NOTE — ED Triage Notes (Signed)
Pt to ED for L lower dental pain since yesterday, known caries, 7/10 pain. Pt in NAD.

## 2023-06-15 ENCOUNTER — Emergency Department: Payer: Self-pay

## 2023-06-15 ENCOUNTER — Emergency Department
Admission: EM | Admit: 2023-06-15 | Discharge: 2023-06-15 | Disposition: A | Discharge status: Home / Self Care | Payer: Self-pay | Attending: Emergency Medicine | Admitting: Emergency Medicine

## 2023-06-15 ENCOUNTER — Other Ambulatory Visit: Payer: Self-pay

## 2023-06-15 DIAGNOSIS — Z20822 Contact with and (suspected) exposure to covid-19: Secondary | ICD-10-CM | POA: Insufficient documentation

## 2023-06-15 DIAGNOSIS — J069 Acute upper respiratory infection, unspecified: Secondary | ICD-10-CM | POA: Insufficient documentation

## 2023-06-15 DIAGNOSIS — F17210 Nicotine dependence, cigarettes, uncomplicated: Secondary | ICD-10-CM | POA: Insufficient documentation

## 2023-06-15 HISTORY — DX: Other specified health status: Z78.9

## 2023-06-15 LAB — RESP PANEL BY RT-PCR (RSV, FLU A&B, COVID)  RVPGX2
Influenza A by PCR: NEGATIVE
Influenza B by PCR: NEGATIVE
Resp Syncytial Virus by PCR: NEGATIVE
SARS Coronavirus 2 by RT PCR: NEGATIVE

## 2023-06-15 NOTE — ED Triage Notes (Signed)
"  Cough, chills, aches x 5 days" per pt

## 2023-06-15 NOTE — ED Provider Notes (Signed)
[facility] Provider Note    Event Date/Time   First MD Initiated Contact with Patient 06/15/23 1123     (approximate)   History   Cough   HPI  Nicolas Bowers is a 37 y.o. male presents to the ED with complaint of cough, chills, body aches for the last 5 days.  Patient also reports diarrhea but denies nausea or vomiting.  No known sick contacts.  Continues to smoke 1 pack of cigarettes per day.     Physical Exam   Triage Vital Signs: ED Triage Vitals  Encounter Vitals Group     BP 06/15/23 1114 109/69     Systolic BP Percentile --      Diastolic BP Percentile --      Pulse Rate 06/15/23 1114 (!) 126     Resp 06/15/23 1114 (!) 21     Temp 06/15/23 1114 97.8 F (36.6 C)     Temp Source 06/15/23 1114 Oral     SpO2 06/15/23 1114 98 %     Weight 06/15/23 1112 136 lb (61.7 kg)     Height 06/15/23 1112 5\' 6"  (1.676 m)     Head Circumference --      Peak Flow --      Pain Score 06/15/23 1112 5     Pain Loc --      Pain Education --      Exclude from Growth Chart --     Most recent vital signs: Vitals:   06/15/23 1114 06/15/23 1322  BP: 109/69 110/70  Pulse: (!) 126 (!) 108  Resp: (!) 21 18  Temp: 97.8 F (36.6 C) 98 F (36.7 C)  SpO2: 98% 98%     General: Awake, no distress.  CV:  Good peripheral perfusion.  Resp:  Normal effort.  Congested cough, no wheezes noted. Abd:  No distention.  Other:     ED Results / Procedures / Treatments   Labs (all labs ordered are listed, but only abnormal results are displayed) Labs Reviewed  RESP PANEL BY RT-PCR (RSV, FLU A&B, COVID)  [id number]      RADIOLOGY Chest x-ray images reviewed and interpreted by myself independent of the radiologist and was negative for infiltrate.  Official radiology report is negative for acute cardiopulmonary changes.    PROCEDURES:  Critical Care performed:   Procedures   MEDICATIONS ORDERED IN ED: Medications - No data to display   IMPRESSION / MDM  / ASSESSMENT AND PLAN / ED COURSE  I reviewed the triage vital signs and the nursing notes.   Differential diagnosis includes, but is not limited to, viral upper respiratory infection, COVID, influenza, RSV, pneumonia, bronchitis.  37 year old male presents to the ED after being sick for 5 days.  Respiratory panel was negative and chest x-ray was reassuring and patient was made aware that he does not have pneumonia.  He will continue to take over-the-counter medication for cough, drink fluids and Tylenol or ibuprofen as needed for body aches.  He is to follow-up with his PCP or urgent care if any continued problems.      Patient's presentation is most consistent with acute illness / injury with system symptoms.  FINAL CLINICAL IMPRESSION(S) / ED DIAGNOSES   Final diagnoses:  Viral URI with cough     Rx / DC Orders   ED Discharge Orders     None        Note:  This document was prepared using Dragon voice recognition  software and may include unintentional dictation errors.   Tommi Rumps, PA-C 06/15/23 1443    Minna Antis, MD 06/16/23 1511

## 2023-06-15 NOTE — Discharge Instructions (Addendum)
Follow-up with your primary care provider if any continued problems or concerns or urgent care as needed.  Continue to drink lots of fluids to stay hydrated.  Over-the-counter cough medication as needed.

## 2023-06-15 NOTE — ED Notes (Signed)
See triage note  Presents with cough,chills and body aches   States sxs; started about 5-6 days ago  Afebrile on arrival

## 2024-02-02 ENCOUNTER — Emergency Department: Admission: EM | Admit: 2024-02-02 | Discharge: 2024-02-02 | Discharge status: Against Medical Advice | Payer: Self-pay

## 2024-02-02 ENCOUNTER — Emergency Department: Payer: Self-pay

## 2024-02-02 ENCOUNTER — Other Ambulatory Visit: Payer: Self-pay

## 2024-02-02 DIAGNOSIS — Z5321 Procedure and treatment not carried out due to patient leaving prior to being seen by health care provider: Secondary | ICD-10-CM | POA: Insufficient documentation

## 2024-02-02 DIAGNOSIS — N50811 Right testicular pain: Secondary | ICD-10-CM | POA: Insufficient documentation

## 2024-02-02 DIAGNOSIS — M545 Low back pain, unspecified: Secondary | ICD-10-CM | POA: Diagnosis not present

## 2024-02-02 LAB — COMPREHENSIVE METABOLIC PANEL WITH GFR
ALT: 22 U/L (ref 0–44)
AST: 43 U/L — ABNORMAL HIGH (ref 15–41)
Albumin: 3.4 g/dL — ABNORMAL LOW (ref 3.5–5.0)
Alkaline Phosphatase: 63 U/L (ref 38–126)
Anion gap: 7 (ref 5–15)
BUN: 14 mg/dL (ref 6–20)
CO2: 27 mmol/L (ref 22–32)
Calcium: 8.9 mg/dL (ref 8.9–10.3)
Chloride: 103 mmol/L (ref 98–111)
Creatinine, Ser: 0.76 mg/dL (ref 0.61–1.24)
GFR, Estimated: >60 mL/min (ref >60)
Glucose, Bld: 135 mg/dL — ABNORMAL HIGH (ref 70–99)
Potassium: 3.4 mmol/L — ABNORMAL LOW (ref 3.5–5.1)
Sodium: 137 mmol/L (ref 135–145)
Total Bilirubin: 0.5 mg/dL (ref 0.0–1.2)
Total Protein: 6 g/dL — ABNORMAL LOW (ref 6.5–8.1)

## 2024-02-02 LAB — CBC
HCT: 35.2 % — ABNORMAL LOW (ref 39.0–52.0)
Hemoglobin: 11.6 g/dL — ABNORMAL LOW (ref 13.0–17.0)
MCH: 26.8 pg (ref 26.0–34.0)
MCHC: 33 g/dL (ref 30.0–36.0)
MCV: 81.3 fL (ref 80.0–100.0)
Platelets: 265 K/uL (ref 150–400)
RBC: 4.33 MIL/uL (ref 4.22–5.81)
RDW: 13.8 % (ref 11.5–15.5)
WBC: 9.1 K/uL (ref 4.0–10.5)
nRBC: 0 % (ref 0.0–0.2)

## 2024-02-02 LAB — LIPASE, BLOOD: Lipase: 29 U/L (ref 11–51)

## 2024-02-02 NOTE — ED Triage Notes (Signed)
 Patient ambulatory to triage with complaints of worsening right sided abdomen and right testicle pain that started approx 2 hours ago. Patient states pain started in the testicle and then moved up to abdomen and right lower back as well.
